# Patient Record
Sex: Female | Born: 1965 | Hispanic: No | Marital: Married | Smoking: Never smoker
Health system: Southern US, Community
[De-identification: ages and names within clinical notes are randomized; demographics above are authoritative.]

## PROBLEM LIST (undated history)

## (undated) DIAGNOSIS — D219 Benign neoplasm of connective and other soft tissue, unspecified: Secondary | ICD-10-CM

## (undated) DIAGNOSIS — R51 Headache: Secondary | ICD-10-CM

## (undated) DIAGNOSIS — K649 Unspecified hemorrhoids: Secondary | ICD-10-CM

## (undated) DIAGNOSIS — D696 Thrombocytopenia, unspecified: Secondary | ICD-10-CM

## (undated) DIAGNOSIS — D249 Benign neoplasm of unspecified breast: Secondary | ICD-10-CM

## (undated) DIAGNOSIS — N644 Mastodynia: Secondary | ICD-10-CM

## (undated) DIAGNOSIS — R519 Headache, unspecified: Secondary | ICD-10-CM

## (undated) DIAGNOSIS — N83209 Unspecified ovarian cyst, unspecified side: Secondary | ICD-10-CM

## (undated) DIAGNOSIS — D649 Anemia, unspecified: Secondary | ICD-10-CM

## (undated) HISTORY — DX: Benign neoplasm of unspecified breast: D24.9

## (undated) HISTORY — DX: Benign neoplasm of connective and other soft tissue, unspecified: D21.9

## (undated) HISTORY — DX: Headache: R51

## (undated) HISTORY — DX: Mastodynia: N64.4

## (undated) HISTORY — DX: Unspecified hemorrhoids: K64.9

## (undated) HISTORY — DX: Thrombocytopenia, unspecified: D69.6

## (undated) HISTORY — DX: Unspecified ovarian cyst, unspecified side: N83.209

## (undated) HISTORY — DX: Headache, unspecified: R51.9

---

## 1999-12-23 ENCOUNTER — Other Ambulatory Visit: Admission: RE | Admit: 1999-12-23 | Discharge: 1999-12-23 | Payer: Self-pay | Admitting: Obstetrics and Gynecology

## 2000-08-21 ENCOUNTER — Other Ambulatory Visit: Admission: RE | Admit: 2000-08-21 | Discharge: 2000-08-21 | Payer: Self-pay | Admitting: Gynecology

## 2001-01-15 ENCOUNTER — Other Ambulatory Visit: Admission: RE | Admit: 2001-01-15 | Discharge: 2001-01-15 | Payer: Self-pay | Admitting: Obstetrics and Gynecology

## 2001-03-08 ENCOUNTER — Encounter: Payer: Self-pay | Admitting: Obstetrics and Gynecology

## 2001-03-08 ENCOUNTER — Inpatient Hospital Stay (HOSPITAL_COMMUNITY): Admission: AD | Admit: 2001-03-08 | Discharge: 2001-03-08 | Payer: Self-pay | Admitting: Obstetrics & Gynecology

## 2001-07-16 ENCOUNTER — Inpatient Hospital Stay (HOSPITAL_COMMUNITY): Admission: AD | Admit: 2001-07-16 | Discharge: 2001-07-16 | Payer: Self-pay | Admitting: Obstetrics and Gynecology

## 2001-08-04 ENCOUNTER — Inpatient Hospital Stay (HOSPITAL_COMMUNITY): Admission: AD | Admit: 2001-08-04 | Discharge: 2001-08-07 | Payer: Self-pay | Admitting: Obstetrics and Gynecology

## 2001-08-13 ENCOUNTER — Encounter: Admission: RE | Admit: 2001-08-13 | Discharge: 2001-09-12 | Payer: Self-pay | Admitting: Obstetrics and Gynecology

## 2002-04-07 ENCOUNTER — Encounter: Admission: RE | Admit: 2002-04-07 | Discharge: 2002-04-07 | Payer: Self-pay | Admitting: Specialist

## 2002-04-07 ENCOUNTER — Encounter: Payer: Self-pay | Admitting: Specialist

## 2002-04-29 ENCOUNTER — Encounter: Admission: RE | Admit: 2002-04-29 | Discharge: 2002-04-29 | Payer: Self-pay | Admitting: Specialist

## 2002-04-29 ENCOUNTER — Encounter: Payer: Self-pay | Admitting: Specialist

## 2003-07-08 ENCOUNTER — Other Ambulatory Visit: Admission: RE | Admit: 2003-07-08 | Discharge: 2003-07-08 | Payer: Self-pay | Admitting: Obstetrics and Gynecology

## 2003-08-13 ENCOUNTER — Encounter: Payer: Self-pay | Admitting: Obstetrics and Gynecology

## 2003-08-13 ENCOUNTER — Ambulatory Visit (HOSPITAL_COMMUNITY): Admission: RE | Admit: 2003-08-13 | Discharge: 2003-08-13 | Payer: Self-pay | Admitting: Obstetrics and Gynecology

## 2003-09-02 ENCOUNTER — Ambulatory Visit (HOSPITAL_COMMUNITY): Admission: RE | Admit: 2003-09-02 | Discharge: 2003-09-02 | Payer: Self-pay | Admitting: Obstetrics and Gynecology

## 2003-09-02 ENCOUNTER — Encounter: Payer: Self-pay | Admitting: Obstetrics and Gynecology

## 2004-02-03 ENCOUNTER — Inpatient Hospital Stay (HOSPITAL_COMMUNITY): Admission: AD | Admit: 2004-02-03 | Discharge: 2004-02-05 | Payer: Self-pay | Admitting: Obstetrics and Gynecology

## 2004-02-06 ENCOUNTER — Encounter: Admission: RE | Admit: 2004-02-06 | Discharge: 2004-03-07 | Payer: Self-pay | Admitting: Obstetrics and Gynecology

## 2004-04-05 ENCOUNTER — Encounter: Admission: RE | Admit: 2004-04-05 | Discharge: 2004-05-05 | Payer: Self-pay | Admitting: Obstetrics and Gynecology

## 2004-05-05 ENCOUNTER — Inpatient Hospital Stay (HOSPITAL_COMMUNITY): Admission: EM | Admit: 2004-05-05 | Discharge: 2004-05-06 | Payer: Self-pay | Admitting: Emergency Medicine

## 2004-07-14 ENCOUNTER — Other Ambulatory Visit: Admission: RE | Admit: 2004-07-14 | Discharge: 2004-07-14 | Payer: Self-pay | Admitting: Obstetrics and Gynecology

## 2005-10-19 ENCOUNTER — Other Ambulatory Visit: Admission: RE | Admit: 2005-10-19 | Discharge: 2005-10-19 | Payer: Self-pay | Admitting: Obstetrics and Gynecology

## 2006-10-24 ENCOUNTER — Other Ambulatory Visit: Admission: RE | Admit: 2006-10-24 | Discharge: 2006-10-24 | Payer: Self-pay | Admitting: Obstetrics and Gynecology

## 2008-12-25 DIAGNOSIS — N644 Mastodynia: Secondary | ICD-10-CM

## 2008-12-25 DIAGNOSIS — D249 Benign neoplasm of unspecified breast: Secondary | ICD-10-CM

## 2008-12-25 HISTORY — DX: Mastodynia: N64.4

## 2008-12-25 HISTORY — DX: Benign neoplasm of unspecified breast: D24.9

## 2008-12-29 ENCOUNTER — Encounter: Admission: RE | Admit: 2008-12-29 | Discharge: 2008-12-29 | Payer: Self-pay | Admitting: Obstetrics and Gynecology

## 2009-07-02 ENCOUNTER — Encounter: Admission: RE | Admit: 2009-07-02 | Discharge: 2009-07-02 | Payer: Self-pay | Admitting: Obstetrics and Gynecology

## 2009-12-30 ENCOUNTER — Encounter: Admission: RE | Admit: 2009-12-30 | Discharge: 2009-12-30 | Payer: Self-pay | Admitting: Obstetrics and Gynecology

## 2010-06-21 ENCOUNTER — Encounter: Admission: RE | Admit: 2010-06-21 | Discharge: 2010-06-21 | Payer: Self-pay | Admitting: Obstetrics and Gynecology

## 2011-01-23 ENCOUNTER — Encounter
Admission: RE | Admit: 2011-01-23 | Discharge: 2011-01-23 | Payer: Self-pay | Source: Home / Self Care | Attending: Obstetrics and Gynecology | Admitting: Obstetrics and Gynecology
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
## 2011-05-12 NOTE — H&P (Signed)
[facility]  Patient:    Lynn Ochoa, Lynn Ochoa                          MRN: [id number] Adm. Date:  [id number] Disc. Date: [id number] Attending:  Shaune Spittle Dictator:   Mack Guise, C.N.M.                         History and Physical  HISTORY OF PRESENT ILLNESS:   [name] is a 45 year old, gravida 2, para 0-0-1-0 at 39-3/7 weeks, EDD August 09, 2001, who presents with spontaneous rupture of membranes for clear fluid at 4 oclock this a.m. She is not feeling any contractions. She reports positive fetal movement, no bleeding, and denies any headache, visual changes, or epigastric pain. Her pregnancy has been followed by the CNM service at [facility] and is remarkable for (1) long cycles, (2) equivocal rubella, (3) thrombocytopenia, (4) group B strep negative. Her pregnancy was initially evaluated at the office of [facility] on January 15, 2001 at approximately [redacted] weeks gestation. Her pregnancy has been complicated by thrombocytopenia which has remained fairly stable at approximately 119,000 platelets when checked on a weekly basis. She has been size equal to dates throughout and has remained normotensive with no proteinuria.  PRENATAL LABORATORY DATA:     On January 15, 2001 finds hemoglobin and hematocrit 13.2 and 40.9, platelets 123,000. Blood type and Rh A positive, antibody screen negative, VDRL nonreactive, rubella equivocal. Hepatitis B surface antigen negative. Pap smear within normal limits. AFP/free beta hCG declined. At 28 weeks one hour glucose challenge 135 and at 36 weeks culture of the vaginal tract is negative.  OBSTETRIC HISTORY:            In 2000 the patient had an induced AB and a a ______ [facility] for the products of conception and the present pregnancy.  PAST MEDICAL HISTORY:         Unremarkable.  FAMILY HISTORY:               Both parents with heart disease. Patients father with a history of chronic hypertension.  GENETIC HISTORY:               Patients brothers baby is deaf.  ALLERGIES:                    No known drug allergies.  SOCIAL HISTORY:               Patient denies the use of tobacco, alcohol, or illicit drugs. [name] is a 45 year old [place] female. Her husband, Lynn Ochoa, is involved and supportive. They do not follow a religious faith.  REVIEW OF SYSTEMS:            There are no signs or symptoms suggestive of focal or systemic disease and the patient is typical of one with a uterine pregnancy at term with premature rupture of membranes in early labor.  PHYSICAL EXAMINATION:  VITAL SIGNS:                  Stable, afebrile.  HEENT:                        Unremarkable.  HEART:                        Regular rate and rhythm.  LUNGS:  Clear.  ABDOMEN:                      Gravid in its contour. Uterine fundus is noted to extend 39 cm above the level of the pubis symphysis. Leopolds maneuvers find the infant to be in a longitudinal lie, cephalic presentation, and the estimated fetal weight is 7-1/2 pounds.  PELVIC:                       Patient is grossly ruptured with copious amounts of clear fluid. On digital exam she is 1 cm dilated, 70% effaced with a cephalic presenting part high, checked by bedside ultrasound and vertex is the presenting part.  ELECTRONIC FETAL MONITORING   Fetal heart rate is reassuring with no decelerations noted. Patient is contracting mildly every three to six minutes.  EXTREMITIES:                  No pathologic edema. DTRs are 1+ with no clonus.  ASSESSMENT:                   1. Intrauterine pregnancy at term.                               2. Premature rupture of membranes.                               3. Thrombocytopenia.  PLAN:                         1. Admit per Dr. Stefano Gaul.                               2. Routine CNM orders. DD:  08/04/01 TD:  08/04/01 Job: [id number]

## 2011-05-12 NOTE — Op Note (Signed)
   Lynn Ochoa, Lynn Ochoa                            ACCOUNT NO.:  [id number]   MEDICAL RECORD NO.:  [id number]                   PATIENT TYPE:  OUT   LOCATION:  ULT                                  [facility]:  [facility]   PHYSICIAN:  Crist Fat. Rivard, M.D.              DATE OF BIRTH:  [birth year]   DATE OF PROCEDURE:  08/13/2003  DATE OF DISCHARGE:                                 OPERATIVE REPORT   PREOPERATIVE DIAGNOSES:  1. Intrauterine pregnancy at 15 weeks' and three days.  2. Advanced maternal age.   POSTOPERATIVE DIAGNOSES:  1. Intrauterine pregnancy at 15 weeks' and three days.  2. Advanced maternal age.   ANESTHESIA:  Local.   PROCEDURE:  Genetic amniocentesis.   SURGEON:  Crist Fat. Rivard, M.D.   PROCEDURE:  After being informed of the planned procedure with possible  complications, including bleeding, infection, and miscarriage rate of 1 in  200, informed consent was obtained and patient was placed in the dorsal  decubitus position.  An ultrasound revealed a size-equal-dates single  intrauterine pregnancy, normal amniotic fluid, posterior placenta, fetal  heart rate of 162 beats per minute.  We proceeded with disinfection of the  abdomen, local anesthesia using lidocaine 1% 3 mL and using a 22-gauge  SonoGuide needle, we performed a single puncture amniocentesis removing 22  mL of clear fluid without complication.  Post amniocentesis fetal heart rate  was 153 beats per minute.  The patient was very well tolerated by the  patient who was discharged home on low physical activity for 24 hours.                                               Crist Fat Rivard, M.D.    SAR/MEDQ  D:  08/13/2003  T:  08/13/2003  Job:  [id number]

## 2012-03-04 ENCOUNTER — Other Ambulatory Visit: Payer: Self-pay | Admitting: Obstetrics and Gynecology

## 2012-03-04 DIAGNOSIS — Z1231 Encounter for screening mammogram for malignant neoplasm of breast: Secondary | ICD-10-CM

## 2012-03-08 ENCOUNTER — Ambulatory Visit
Admission: RE | Admit: 2012-03-08 | Discharge: 2012-03-08 | Disposition: A | Discharge status: Home / Self Care | Payer: PRIVATE HEALTH INSURANCE | Source: Ambulatory Visit | Attending: Obstetrics and Gynecology | Admitting: Obstetrics and Gynecology

## 2012-03-08 DIAGNOSIS — Z1231 Encounter for screening mammogram for malignant neoplasm of breast: Secondary | ICD-10-CM

## 2012-03-12 ENCOUNTER — Other Ambulatory Visit: Payer: Self-pay | Admitting: Obstetrics and Gynecology

## 2012-03-12 DIAGNOSIS — R928 Other abnormal and inconclusive findings on diagnostic imaging of breast: Secondary | ICD-10-CM

## 2012-03-19 ENCOUNTER — Ambulatory Visit
Admission: RE | Admit: 2012-03-19 | Discharge: 2012-03-19 | Disposition: A | Discharge status: Home / Self Care | Payer: PRIVATE HEALTH INSURANCE | Source: Ambulatory Visit | Attending: Obstetrics and Gynecology | Admitting: Obstetrics and Gynecology

## 2012-03-19 DIAGNOSIS — R928 Other abnormal and inconclusive findings on diagnostic imaging of breast: Secondary | ICD-10-CM

## 2012-11-06 ENCOUNTER — Ambulatory Visit: Payer: PRIVATE HEALTH INSURANCE | Admitting: Obstetrics and Gynecology

## 2012-11-20 ENCOUNTER — Encounter: Payer: Self-pay | Admitting: Obstetrics and Gynecology

## 2012-11-20 ENCOUNTER — Ambulatory Visit (INDEPENDENT_AMBULATORY_CARE_PROVIDER_SITE_OTHER): Payer: PRIVATE HEALTH INSURANCE | Admitting: Obstetrics and Gynecology

## 2012-11-20 VITALS — BP 100/60 | HR 90 | Ht 63.0 in | Wt 152.0 lb

## 2012-11-20 DIAGNOSIS — N83209 Unspecified ovarian cyst, unspecified side: Secondary | ICD-10-CM | POA: Insufficient documentation

## 2012-11-20 DIAGNOSIS — Z124 Encounter for screening for malignant neoplasm of cervix: Secondary | ICD-10-CM

## 2012-11-20 NOTE — Patient Instructions (Signed)
Ovarian Cyst The ovaries are small organs that are on each side of the uterus. The ovaries are the organs that produce the female hormones, estrogen and progesterone. An ovarian cyst is a sac filled with fluid that can vary in its size. It is normal for a small cyst to form in women who are in the childbearing age and who have menstrual periods. This type of cyst is called a follicle cyst that becomes an ovulation cyst (corpus luteum cyst) after it produces the women's egg. It later goes away on its own if the woman does not become pregnant. There are other kinds of ovarian cysts that may cause problems and may need to be treated. The most serious problem is a cyst with cancer. It should be noted that menopausal women who have an ovarian cyst are at a higher risk of it being a cancer cyst. They should be evaluated very quickly, thoroughly and followed closely. This is especially true in menopausal women because of the high rate of ovarian cancer in women in menopause. CAUSES AND TYPES OF OVARIAN CYSTS:  FUNCTIONAL CYST: The follicle/corpus luteum cyst is a functional cyst that occurs every month during ovulation with the menstrual cycle. They go away with the next menstrual cycle if the woman does not get pregnant. Usually, there are no symptoms with a functional cyst.  ENDOMETRIOMA CYST: This cyst develops from the lining of the uterus tissue. This cyst gets in or on the ovary. It grows every month from the bleeding during the menstrual period. It is also called a "chocolate cyst" because it becomes filled with blood that turns brown. This cyst can cause pain in the lower abdomen during intercourse and with your menstrual period.  CYSTADENOMA CYST: This cyst develops from the cells on the outside of the ovary. They usually are not cancerous. They can get very big and cause lower abdomen pain and pain with intercourse. This type of cyst can twist on itself, cut off its blood supply and cause severe pain. It  also can easily rupture and cause a lot of pain.  DERMOID CYST: This type of cyst is sometimes found in both ovaries. They are found to have different kinds of body tissue in the cyst. The tissue includes skin, teeth, hair, and/or cartilage. They usually do not have symptoms unless they get very big. Dermoid cysts are rarely cancerous.  POLYCYSTIC OVARY: This is a rare condition with hormone problems that produces many small cysts on both ovaries. The cysts are follicle-like cysts that never produce an egg and become a corpus luteum. It can cause an increase in body weight, infertility, acne, increase in body and facial hair and lack of menstrual periods or rare menstrual periods. Many women with this problem develop type 2 diabetes. The exact cause of this problem is unknown. A polycystic ovary is rarely cancerous.  THECA LUTEIN CYST: Occurs when too much hormone (human chorionic gonadotropin) is produced and over-stimulates the ovaries to produce an egg. They are frequently seen when doctors stimulate the ovaries for invitro-fertilization (test tube babies).  LUTEOMA CYST: This cyst is seen during pregnancy. Rarely it can cause an obstruction to the birth canal during labor and delivery. They usually go away after delivery. SYMPTOMS   Pelvic pain or pressure.  Pain during sexual intercourse.  Increasing girth (swelling) of the abdomen.  Abnormal menstrual periods.  Increasing pain with menstrual periods.  You stop having menstrual periods and you are not pregnant. DIAGNOSIS  The diagnosis can   be made during:  Routine or annual pelvic examination (common).  Ultrasound.  X-ray of the pelvis.  CT Scan.  MRI.  Blood tests. TREATMENT   Treatment may only be to follow the cyst monthly for 2 to 3 months with your caregiver. Many go away on their own, especially functional cysts.  May be aspirated (drained) with a long needle with ultrasound, or by laparoscopy (inserting a tube into  the pelvis through a small incision).  The whole cyst can be removed by laparoscopy.  Sometimes the cyst may need to be removed through an incision in the lower abdomen.  Hormone treatment is sometimes used to help dissolve certain cysts.  Birth control pills are sometimes used to help dissolve certain cysts. HOME CARE INSTRUCTIONS  Follow your caregiver's advice regarding:  Medicine.  Follow up visits to evaluate and treat the cyst.  You may need to come back or make an appointment with another caregiver, to find the exact cause of your cyst, if your caregiver is not a gynecologist.  Get your yearly and recommended pelvic examinations and Pap tests.  Let your caregiver know if you have had an ovarian cyst in the past. SEEK MEDICAL CARE IF:   Your periods are late, irregular, they stop, or are painful.  Your stomach (abdomen) or pelvic pain does not go away.  Your stomach becomes larger or swollen.  You have pressure on your bladder or trouble emptying your bladder completely.  You have painful sexual intercourse.  You have feelings of fullness, pressure, or discomfort in your stomach.  You lose weight for no apparent reason.  You feel generally ill.  You become constipated.  You lose your appetite.  You develop acne.  You have an increase in body and facial hair.  You are gaining weight, without changing your exercise and eating habits.  You think you are pregnant. SEEK IMMEDIATE MEDICAL CARE IF:   You have increasing abdominal pain.  You feel sick to your stomach (nausea) and/or vomit.  You develop a fever that comes on suddenly.  You develop abdominal pain during a bowel movement.  Your menstrual periods become heavier than usual. Document Released: 12/11/2005 Document Revised: 03/04/2012 Document Reviewed: 10/14/2009 Exit[employer] Patient Information 2013 [employer].  

## 2012-11-20 NOTE — Progress Notes (Signed)
Subjective:  Last Pap: 11/06/11 WNL: Yes Regular Periods:yes Contraception: condoms  Monthly Breast exam:no Tetanus<57yrs:yes Nl.Bladder Function:yes Daily BMs:no Healthy Diet:no Calcium:no Mammogram:yes Date of Mammogram: 03/19/12 ltd diagnostic on left breast for possible mass  Exercise:no Have often Exercise: n/a Seatbelt: yes Abuse at home: no Stressful work:no Sigmoid-colonoscopy: n/a Bone Density: No PCP: none Change in PMH: none  Change in [facility]: none  Lynn Ochoa is a 46 y.o. female, G3P2, who presents for an annual exam. Has some LLQ pain.  Also has intermittent right lateral breast pain.  Drinks several caffieneated beverages daily    History   Social History  . Marital Status: Married    Spouse Name: N/A    Number of Children: N/A  . Years of Education: N/A   Social History Main Topics  . Smoking status: Never Smoker   . Smokeless tobacco: None  . Alcohol Use: No  . Drug Use: No  . Sexually Active: None   Other Topics Concern  . None   Social History Narrative  . None    Menstrual cycle:   LMP: Patient's last menstrual period was 11/13/2012.           Cycle: monthly for 4-5 days, light.  Some LLQ pain with menses, but takes no meds.  No dyspareunia  The following portions of the patient's history were reviewed and updated as appropriate: allergies, current medications, past family history, past medical history, past social history, past surgical history and problem list.  Review of Systems Pertinent items are noted in HPI. Breast:Negative for breast lump,nipple discharge or nipple retraction Gastrointestinal: Negative for abdominal pain, change in bowel habits or rectal bleeding Urinary:negative   Objective:    BP 100/60  Pulse 90  Ht 5\' 3"  (1.6 m)  Wt 152 lb (68.947 kg)  BMI 26.93 kg/m2  LMP 11/13/2012    Weight:  Wt Readings from Last 1 Encounters:  11/20/12 152 lb (68.947 kg)          BMI: Body mass index is 26.93 kg/(m^2).  General  Appearance: Alert, appropriate appearance for age. No acute distress HEENT: Grossly normal Neck / Thyroid: Supple, no masses, nodes or enlargement Lungs: clear to auscultation bilaterally Back: No CVA tenderness Breast Exam: No masses or nodes.No dimpling, nipple retraction or discharge. Cardiovascular: Regular rate and rhythm. S1, S2, no murmur Gastrointestinal: Soft, non-tender, no masses or organomegaly Pelvic Exam: External genitalia: normal general appearance Vaginal: normal mucosa without prolapse or lesions Cervix: normal appearance Adnexa: LLQ tenderness and fullness Uterus: normal single, nontender Rectovaginal: normal rectal, no masses Lymphatic Exam: Non-palpable nodes in neck, clavicular, axillary, or inguinal regions Skin: no rash or abnormalities Neurologic: Normal gait and speech, no tremor  Psychiatric: Alert and oriented, appropriate affect.   Wet Prep:not applicable Urinalysis:not applicable UPT: Not done   Assessment:    Hx left ovarian cyst for 11 years stable in size with pt declining surgical intervention  Intermittent pelvic pain Mastodynia with negative mammogram workup   Plan:    mammogram pap smear return annually or prn RTO for followup U/S STD screening: declined Contraception:condoms   Dierdre Forth MD

## 2012-11-25 LAB — PAP IG AND HPV HIGH-RISK

## 2013-01-16 ENCOUNTER — Ambulatory Visit: Payer: PRIVATE HEALTH INSURANCE

## 2013-01-16 ENCOUNTER — Other Ambulatory Visit: Payer: Self-pay | Admitting: Obstetrics and Gynecology

## 2013-01-16 ENCOUNTER — Encounter: Payer: Self-pay | Admitting: Obstetrics and Gynecology

## 2013-01-16 ENCOUNTER — Ambulatory Visit: Payer: PRIVATE HEALTH INSURANCE | Admitting: Obstetrics and Gynecology

## 2013-01-16 VITALS — BP 110/64 | Ht 62.0 in | Wt 151.0 lb

## 2013-01-16 DIAGNOSIS — N83209 Unspecified ovarian cyst, unspecified side: Secondary | ICD-10-CM

## 2013-01-16 NOTE — Progress Notes (Signed)
FOLLOW UP   ULTRASOUND: Uterus: Length: 7.36 cm   Width:  4.87 cm   Height:  4.41 cm ROV:  Length: 2.51 cm   Width: 1.87 cm   Height: 1.60 cm LOV:   Length: 6.08 cm   Width: 4.87 cm   Height: 3.93 cm  Endo thickness:  0.496 cm   Left ovary: Separated Ovarian Cyst is visualized Right ovary:Normal Fibroids:yes  Number:  1n/a  Size(s):1.1 x 1 x 1.1 cm  CDS fluid:no  Comment: One intramural fibroid is seen. Right Posterior uterus. Endometrium tri layered - WNLs Low level internal echoes are seen. But predominantly simple in appearance.  Raises question of an endometrioma. Chart history reports that this cyst lesion has been visualized over an 11 year period. Measures: 5.6 x 4.1 x 4.0 cm  ASSESSMENT: Stable asymptomatic ovarian cyst for 11 years  PLAN:  Pt declines removal. Sx of torsion and/or rupture reviewed. Will image again with change in sx or exam

## 2013-04-25 ENCOUNTER — Other Ambulatory Visit: Payer: Self-pay | Admitting: Family Medicine

## 2013-04-25 DIAGNOSIS — R1032 Left lower quadrant pain: Secondary | ICD-10-CM

## 2013-04-25 DIAGNOSIS — K5900 Constipation, unspecified: Secondary | ICD-10-CM

## 2013-05-05 ENCOUNTER — Ambulatory Visit
Admission: RE | Admit: 2013-05-05 | Discharge: 2013-05-05 | Disposition: A | Discharge status: Home / Self Care | Payer: PRIVATE HEALTH INSURANCE | Source: Ambulatory Visit | Attending: Family Medicine | Admitting: Family Medicine

## 2013-05-05 DIAGNOSIS — R1032 Left lower quadrant pain: Secondary | ICD-10-CM

## 2013-05-05 DIAGNOSIS — K5900 Constipation, unspecified: Secondary | ICD-10-CM

## 2013-05-05 MED ORDER — IOHEXOL 300 MG/ML  SOLN
100.0000 mL | Freq: Once | INTRAMUSCULAR | Status: AC | PRN
  Administered 2013-05-05: 100 mL via INTRAVENOUS

## 2014-05-11 ENCOUNTER — Other Ambulatory Visit: Payer: Self-pay | Admitting: Family Medicine

## 2014-05-11 DIAGNOSIS — R911 Solitary pulmonary nodule: Secondary | ICD-10-CM

## 2014-10-26 ENCOUNTER — Encounter: Payer: Self-pay | Admitting: Obstetrics and Gynecology

## 2015-04-15 ENCOUNTER — Other Ambulatory Visit: Payer: Self-pay | Admitting: Family Medicine

## 2015-04-15 DIAGNOSIS — R911 Solitary pulmonary nodule: Secondary | ICD-10-CM

## 2015-04-30 ENCOUNTER — Other Ambulatory Visit: Payer: Self-pay | Admitting: Family Medicine

## 2015-04-30 DIAGNOSIS — R911 Solitary pulmonary nodule: Secondary | ICD-10-CM

## 2015-05-05 ENCOUNTER — Ambulatory Visit
Admission: RE | Admit: 2015-05-05 | Discharge: 2015-05-05 | Disposition: A | Discharge status: Home / Self Care | Payer: Commercial Managed Care - PPO | Source: Ambulatory Visit | Attending: Family Medicine | Admitting: Family Medicine

## 2015-05-05 DIAGNOSIS — R911 Solitary pulmonary nodule: Secondary | ICD-10-CM

## 2015-10-25 ENCOUNTER — Other Ambulatory Visit: Payer: Self-pay

## 2015-10-25 ENCOUNTER — Other Ambulatory Visit: Payer: Self-pay | Admitting: Family Medicine

## 2015-10-25 DIAGNOSIS — E0789 Other specified disorders of thyroid: Secondary | ICD-10-CM

## 2015-10-26 ENCOUNTER — Other Ambulatory Visit: Payer: Self-pay

## 2015-10-26 ENCOUNTER — Other Ambulatory Visit: Payer: Self-pay | Admitting: Family Medicine

## 2015-10-26 ENCOUNTER — Ambulatory Visit
Admission: RE | Admit: 2015-10-26 | Discharge: 2015-10-26 | Disposition: A | Discharge status: Home / Self Care | Payer: Commercial Managed Care - PPO | Source: Ambulatory Visit | Attending: Family Medicine | Admitting: Family Medicine

## 2015-10-26 ENCOUNTER — Inpatient Hospital Stay
Admission: RE | Admit: 2015-10-26 | Discharge: 2015-10-26 | Disposition: A | Discharge status: Home / Self Care | Payer: Self-pay | Source: Ambulatory Visit | Attending: Family Medicine | Admitting: Family Medicine

## 2015-10-26 DIAGNOSIS — E0789 Other specified disorders of thyroid: Secondary | ICD-10-CM

## 2017-03-21 DIAGNOSIS — G4452 New daily persistent headache (NDPH): Secondary | ICD-10-CM | POA: Diagnosis not present

## 2017-03-29 ENCOUNTER — Other Ambulatory Visit: Payer: Self-pay | Admitting: Family Medicine

## 2017-03-29 DIAGNOSIS — G4452 New daily persistent headache (NDPH): Secondary | ICD-10-CM

## 2017-04-08 ENCOUNTER — Ambulatory Visit
Admission: RE | Admit: 2017-04-08 | Discharge: 2017-04-08 | Disposition: A | Discharge status: Home / Self Care | Payer: Commercial Managed Care - PPO | Source: Ambulatory Visit | Attending: Family Medicine | Admitting: Family Medicine

## 2017-04-08 DIAGNOSIS — R51 Headache: Secondary | ICD-10-CM | POA: Diagnosis not present

## 2017-04-08 DIAGNOSIS — G4452 New daily persistent headache (NDPH): Secondary | ICD-10-CM

## 2017-04-08 MED ORDER — GADOBENATE DIMEGLUMINE 529 MG/ML IV SOLN
15.0000 mL | Freq: Once | INTRAVENOUS | Status: AC | PRN
  Administered 2017-04-08: 15 mL via INTRAVENOUS

## 2017-05-31 DIAGNOSIS — N920 Excessive and frequent menstruation with regular cycle: Secondary | ICD-10-CM | POA: Diagnosis not present

## 2017-06-12 DIAGNOSIS — R339 Retention of urine, unspecified: Secondary | ICD-10-CM | POA: Diagnosis not present

## 2017-06-12 DIAGNOSIS — R7303 Prediabetes: Secondary | ICD-10-CM | POA: Diagnosis not present

## 2017-06-12 DIAGNOSIS — R102 Pelvic and perineal pain: Secondary | ICD-10-CM | POA: Diagnosis not present

## 2017-06-12 DIAGNOSIS — R3121 Asymptomatic microscopic hematuria: Secondary | ICD-10-CM | POA: Diagnosis not present

## 2017-06-12 DIAGNOSIS — R3914 Feeling of incomplete bladder emptying: Secondary | ICD-10-CM | POA: Diagnosis not present

## 2017-06-25 ENCOUNTER — Inpatient Hospital Stay (HOSPITAL_COMMUNITY): Payer: Commercial Managed Care - PPO | Admitting: Anesthesiology

## 2017-06-25 ENCOUNTER — Other Ambulatory Visit: Payer: Self-pay | Admitting: Obstetrics and Gynecology

## 2017-06-25 ENCOUNTER — Encounter (HOSPITAL_COMMUNITY): Payer: Self-pay | Admitting: *Deleted

## 2017-06-25 ENCOUNTER — Observation Stay (HOSPITAL_COMMUNITY)
Admission: AD | Admit: 2017-06-25 | Discharge: 2017-06-26 | Disposition: A | Discharge status: Home / Self Care | Payer: Commercial Managed Care - PPO | Source: Ambulatory Visit | Attending: Obstetrics and Gynecology | Admitting: Obstetrics and Gynecology

## 2017-06-25 ENCOUNTER — Encounter (HOSPITAL_COMMUNITY)
Admission: AD | Disposition: A | Discharge status: Home / Self Care | Payer: Self-pay | Source: Ambulatory Visit | Attending: Obstetrics and Gynecology

## 2017-06-25 ENCOUNTER — Ambulatory Visit: Admit: 2017-06-25 | Payer: Commercial Managed Care - PPO | Admitting: Obstetrics and Gynecology

## 2017-06-25 ENCOUNTER — Encounter (HOSPITAL_COMMUNITY): Payer: Self-pay

## 2017-06-25 DIAGNOSIS — D259 Leiomyoma of uterus, unspecified: Principal | ICD-10-CM | POA: Insufficient documentation

## 2017-06-25 DIAGNOSIS — Z9114 Patient's other noncompliance with medication regimen: Secondary | ICD-10-CM | POA: Diagnosis not present

## 2017-06-25 DIAGNOSIS — R1909 Other intra-abdominal and pelvic swelling, mass and lump: Secondary | ICD-10-CM | POA: Diagnosis not present

## 2017-06-25 DIAGNOSIS — D649 Anemia, unspecified: Secondary | ICD-10-CM | POA: Diagnosis present

## 2017-06-25 DIAGNOSIS — Z8249 Family history of ischemic heart disease and other diseases of the circulatory system: Secondary | ICD-10-CM | POA: Insufficient documentation

## 2017-06-25 DIAGNOSIS — D250 Submucous leiomyoma of uterus: Secondary | ICD-10-CM | POA: Diagnosis present

## 2017-06-25 DIAGNOSIS — N920 Excessive and frequent menstruation with regular cycle: Secondary | ICD-10-CM | POA: Diagnosis present

## 2017-06-25 HISTORY — DX: Anemia, unspecified: D64.9

## 2017-06-25 HISTORY — PX: HYSTEROSCOPY WITH RESECTOSCOPE: SHX5395

## 2017-06-25 LAB — CBC
HEMATOCRIT: 17.7 % — AB (ref 36.0–46.0)
HEMOGLOBIN: 5.4 g/dL — AB (ref 12.0–15.0)
MCH: 26.1 pg (ref 26.0–34.0)
MCHC: 30.5 g/dL (ref 30.0–36.0)
MCV: 85.5 fL (ref 78.0–100.0)
Platelets: 267 10*3/uL (ref 150–400)
RBC: 2.07 MIL/uL — AB (ref 3.87–5.11)
RDW: 17.1 % — ABNORMAL HIGH (ref 11.5–15.5)
WBC: 9 10*3/uL (ref 4.0–10.5)

## 2017-06-25 LAB — URINALYSIS, ROUTINE W REFLEX MICROSCOPIC
Bacteria, UA: NONE SEEN
Bilirubin Urine: NEGATIVE
Glucose, UA: NEGATIVE mg/dL
KETONES UR: NEGATIVE mg/dL
Leukocytes, UA: NEGATIVE
Nitrite: NEGATIVE
PH: 5 (ref 5.0–8.0)
PROTEIN: NEGATIVE mg/dL
SQUAMOUS EPITHELIAL / LPF: NONE SEEN
Specific Gravity, Urine: 1.006 (ref 1.005–1.030)

## 2017-06-25 LAB — ABO/RH: ABO/RH(D): A POS

## 2017-06-25 LAB — POCT PREGNANCY, URINE: Preg Test, Ur: NEGATIVE

## 2017-06-25 LAB — PREPARE RBC (CROSSMATCH)

## 2017-06-25 SURGERY — HYSTEROSCOPY, USING RESECTOSCOPE
Anesthesia: General | Site: Vagina

## 2017-06-25 MED ORDER — FENTANYL CITRATE (PF) 100 MCG/2ML IJ SOLN
INTRAMUSCULAR | Status: DC | PRN
  Administered 2017-06-25 (×2): 25 ug via INTRAVENOUS
  Administered 2017-06-25: 50 ug via INTRAVENOUS

## 2017-06-25 MED ORDER — LIDOCAINE HCL (CARDIAC) 20 MG/ML IV SOLN
INTRAVENOUS | Status: DC | PRN
  Administered 2017-06-25: 50 mg via INTRAVENOUS

## 2017-06-25 MED ORDER — PROMETHAZINE HCL 25 MG/ML IJ SOLN
6.2500 mg | INTRAMUSCULAR | Status: DC | PRN
  Administered 2017-06-25: 6.25 mg via INTRAVENOUS

## 2017-06-25 MED ORDER — PROPOFOL 10 MG/ML IV BOLUS
INTRAVENOUS | Status: AC
  Filled 2017-06-25: qty 20

## 2017-06-25 MED ORDER — SODIUM CHLORIDE 0.9 % IR SOLN
Status: DC | PRN
  Administered 2017-06-25: 3000 mL

## 2017-06-25 MED ORDER — FAMOTIDINE IN NACL 20-0.9 MG/50ML-% IV SOLN
INTRAVENOUS | Status: AC
  Administered 2017-06-25: 20 mg via INTRAVENOUS
  Filled 2017-06-25: qty 50

## 2017-06-25 MED ORDER — SODIUM CHLORIDE 0.9 % IV SOLN: Freq: Once | INTRAVENOUS | Status: DC

## 2017-06-25 MED ORDER — PROPOFOL 10 MG/ML IV BOLUS
INTRAVENOUS | Status: DC | PRN
  Administered 2017-06-25: 100 mg via INTRAVENOUS

## 2017-06-25 MED ORDER — FENTANYL CITRATE (PF) 100 MCG/2ML IJ SOLN
INTRAMUSCULAR | Status: AC
  Filled 2017-06-25: qty 2

## 2017-06-25 MED ORDER — HYDROMORPHONE HCL 1 MG/ML IJ SOLN: 0.2500 mg | INTRAMUSCULAR | Status: DC | PRN

## 2017-06-25 MED ORDER — PHENYLEPHRINE 40 MCG/ML (10ML) SYRINGE FOR IV PUSH (FOR BLOOD PRESSURE SUPPORT)
PREFILLED_SYRINGE | INTRAVENOUS | Status: AC
  Filled 2017-06-25: qty 10

## 2017-06-25 MED ORDER — PROMETHAZINE HCL 25 MG/ML IJ SOLN
INTRAMUSCULAR | Status: AC
  Filled 2017-06-25: qty 1

## 2017-06-25 MED ORDER — MIDAZOLAM HCL 2 MG/2ML IJ SOLN
INTRAMUSCULAR | Status: DC | PRN
  Administered 2017-06-25 (×2): 1 mg via INTRAVENOUS

## 2017-06-25 MED ORDER — DEXTROSE 5 % IV SOLN
2.0000 g | INTRAVENOUS | Status: AC
  Administered 2017-06-25: 2 g via INTRAVENOUS
  Filled 2017-06-25: qty 2

## 2017-06-25 MED ORDER — OXYCODONE HCL 5 MG/5ML PO SOLN: 5.0000 mg | Freq: Once | ORAL | Status: DC | PRN

## 2017-06-25 MED ORDER — SUCCINYLCHOLINE CHLORIDE 20 MG/ML IJ SOLN
INTRAMUSCULAR | Status: DC | PRN
  Administered 2017-06-25: 100 mg via INTRAVENOUS

## 2017-06-25 MED ORDER — OXYCODONE HCL 5 MG PO TABS: 5.0000 mg | ORAL_TABLET | Freq: Once | ORAL | Status: DC | PRN

## 2017-06-25 MED ORDER — PHENYLEPHRINE HCL 10 MG/ML IJ SOLN
INTRAMUSCULAR | Status: DC | PRN
  Administered 2017-06-25 (×8): 80 ug via INTRAVENOUS

## 2017-06-25 MED ORDER — MEPERIDINE HCL 25 MG/ML IJ SOLN: 6.2500 mg | INTRAMUSCULAR | Status: DC | PRN

## 2017-06-25 MED ORDER — KETOROLAC TROMETHAMINE 30 MG/ML IJ SOLN: 30.0000 mg | Freq: Four times a day (QID) | INTRAMUSCULAR | Status: DC

## 2017-06-25 MED ORDER — LACTATED RINGERS IV SOLN
INTRAVENOUS | Status: DC
  Administered 2017-06-25: 125 mL/h via INTRAVENOUS

## 2017-06-25 MED ORDER — MIDAZOLAM HCL 2 MG/2ML IJ SOLN
INTRAMUSCULAR | Status: AC
  Filled 2017-06-25: qty 2

## 2017-06-25 MED ORDER — ONDANSETRON HCL 4 MG/2ML IJ SOLN
INTRAMUSCULAR | Status: DC | PRN
  Administered 2017-06-25: 4 mg via INTRAVENOUS

## 2017-06-25 MED ORDER — LACTATED RINGERS IV SOLN
INTRAVENOUS | Status: DC | PRN
  Administered 2017-06-25 (×2): via INTRAVENOUS

## 2017-06-25 MED ORDER — FAMOTIDINE IN NACL 20-0.9 MG/50ML-% IV SOLN
20.0000 mg | Freq: Once | INTRAVENOUS | Status: AC
  Administered 2017-06-25: 20 mg via INTRAVENOUS

## 2017-06-25 MED ORDER — KETOROLAC TROMETHAMINE 30 MG/ML IJ SOLN
INTRAMUSCULAR | Status: DC | PRN
  Administered 2017-06-25: 30 mg via INTRAVENOUS

## 2017-06-25 MED ORDER — DEXAMETHASONE SODIUM PHOSPHATE 10 MG/ML IJ SOLN
INTRAMUSCULAR | Status: DC | PRN
  Administered 2017-06-25: 4 mg via INTRAVENOUS

## 2017-06-25 MED ORDER — ONDANSETRON HCL 4 MG/2ML IJ SOLN
INTRAMUSCULAR | Status: AC
  Filled 2017-06-25: qty 2

## 2017-06-25 MED ORDER — LIDOCAINE HCL (CARDIAC) 20 MG/ML IV SOLN
INTRAVENOUS | Status: AC
  Filled 2017-06-25: qty 5

## 2017-06-25 MED ORDER — DEXAMETHASONE SODIUM PHOSPHATE 4 MG/ML IJ SOLN
INTRAMUSCULAR | Status: AC
  Filled 2017-06-25: qty 1

## 2017-06-25 MED ORDER — SUCCINYLCHOLINE CHLORIDE 200 MG/10ML IV SOSY
PREFILLED_SYRINGE | INTRAVENOUS | Status: AC
  Filled 2017-06-25: qty 10

## 2017-06-25 SURGICAL SUPPLY — 30 items
BIPOLAR CUTTING LOOP 21FR (ELECTRODE) ×1
BOOTIES KNEE HIGH SLOAN (MISCELLANEOUS) ×2 IMPLANT
CANISTER SUCT 3000ML PPV (MISCELLANEOUS) ×2 IMPLANT
CATH ROBINSON RED A/P 16FR (CATHETERS) ×2 IMPLANT
CLOTH BEACON ORANGE TIMEOUT ST (SAFETY) ×2 IMPLANT
CONTAINER PREFILL 10% NBF 60ML (FORM) ×2 IMPLANT
COUNTER NEEDLE 1200 MAGNETIC (NEEDLE) ×2 IMPLANT
DILATOR CANAL MILEX (MISCELLANEOUS) IMPLANT
ELECT COAG BIPOL BALL 21FR (ELECTRODE) IMPLANT
ELECT REM PT RETURN 9FT ADLT (ELECTROSURGICAL) ×4
ELECTRODE REM PT RTRN 9FT ADLT (ELECTROSURGICAL) ×2 IMPLANT
GLOVE BIOGEL PI IND STRL 7.0 (GLOVE) ×1 IMPLANT
GLOVE BIOGEL PI INDICATOR 7.0 (GLOVE) ×1
GLOVE SURG SS PI 6.5 STRL IVOR (GLOVE) ×4 IMPLANT
GOWN STRL REUS W/TWL LRG LVL3 (GOWN DISPOSABLE) ×6 IMPLANT
LOOP CUTTING BIPOLAR 21FR (ELECTRODE) ×1 IMPLANT
PACK VAGINAL MINOR WOMEN LF (CUSTOM PROCEDURE TRAY) ×2 IMPLANT
PAD OB MATERNITY 4.3X12.25 (PERSONAL CARE ITEMS) ×2 IMPLANT
PENCIL BUTTON HOLSTER BLD 10FT (ELECTRODE) ×2 IMPLANT
SUT VIC AB 0 CT1 27 (SUTURE) ×2
SUT VIC AB 0 CT1 27XBRD ANBCTR (SUTURE) ×2 IMPLANT
SUT VIC AB 1 CT1 36 (SUTURE) ×2 IMPLANT
SUT VICRYL 0 27 CT2 27 ABS (SUTURE) ×2 IMPLANT
SUT VICRYL 0 ENDOLOOP (SUTURE) ×4 IMPLANT
SUT VICRYL 0 UR6 27IN ABS (SUTURE) ×8 IMPLANT
TOWEL OR 17X24 6PK STRL BLUE (TOWEL DISPOSABLE) ×4 IMPLANT
TUBING AQUILEX INFLOW (TUBING) ×2 IMPLANT
TUBING AQUILEX OUTFLOW (TUBING) ×2 IMPLANT
TUBING NON-CON 1/4 X 20 CONN (TUBING) ×2 IMPLANT
YANKAUER SUCT BULB TIP NO VENT (SUCTIONS) ×2 IMPLANT

## 2017-06-25 NOTE — Transfer of Care (Signed)
Immediate Anesthesia Transfer of Care Note  Patient: Lynn Ochoa  Procedure(s) Performed: Procedure(s): HYSTEROSCOPY WITH REMOVAL CERVICAL FIBROID, DILATATION AND CURRATAGE (N/A)  Patient Location: PACU  Anesthesia Type:General  Level of Consciousness: awake, alert  and oriented  Airway & Oxygen Therapy: Patient Spontanous Breathing and Patient connected to nasal cannula oxygen  Post-op Assessment: Report given to RN and Post -op Vital signs reviewed and stable  Post vital signs: Reviewed and stable  Last Vitals:  Vitals:   06/25/17 1859  BP: (!) 125/55  Pulse: (!) 105  Resp: 17  Temp: 36.4 C    Last Pain:  Vitals:   06/25/17 1859  TempSrc: Oral         Complications: No apparent anesthesia complications

## 2017-06-25 NOTE — Progress Notes (Signed)
CRITICAL VALUE ALERT  Critical Value:  5.4  Date & Time Notied:  1939  Provider Notified: Dr Leo Grosser at bedside. Notified Danielle RN  Orders Received/Actions taken: None at this time.

## 2017-06-25 NOTE — H&P (Signed)
Lynn Ochoa is an 51 y.o. female. Who presents for removal of mass that is causing cervical dilation, probable fibroid  Pertinent Gynecological History: Menses: irregular occurring approximately every 26 days with spotting approximately 7 days per month and last cycle lasting since 05/25/17 Bleeding: dysfunctional uterine bleeding Contraception: condoms DES exposure: unknown Blood transfusions: none Sexually transmitted diseases: no past history Previous GYN Procedures: none  Last mammogram: normal Date: 2017 Last pap: normal Date:  OB History: G2, P2   Menstrual History: Menarche age: 81 No LMP recorded. Patient is not currently having periods (Reason: Perimenopausal).    Past Medical History:  Diagnosis Date  . Anemia   . Breast adenoma 2010  . Hemorrhoid   . Mastodynia 2010  . Simple ovarian cyst    left   . SVD (spontaneous vaginal delivery)    x 2  . Thrombocytopenia ([facility])    hx of    History reviewed. No pertinent surgical history.  Family History  Problem Relation Age of Onset  . Heart disease Mother   . Hypotension Mother   . Hypertension Father   . Heart disease Father     Social History:  reports that she has never smoked. She has never used smokeless tobacco. She reports that she does not drink alcohol or use drugs.  Allergies: No Known Allergies  No prescriptions prior to admission.    Review of Systems  Constitutional: Positive for malaise/fatigue.  HENT: Negative for tinnitus.        Can hear heart beat in her ear for the last 4 days  Respiratory: Positive for shortness of breath (with activity).   Cardiovascular: Positive for palpitations.  Gastrointestinal: Positive for abdominal pain (for 2 days) and constipation (with iron).  Genitourinary: Positive for dysuria.       Episode of urinary retention 1 week ago.  Musculoskeletal: Positive for back pain.  Skin:       PALE  Neurological: Positive for dizziness and headaches (posterior).   Finger numbness on awakening  Psychiatric/Behavioral: Negative.     Blood pressure (!) 125/55, pulse (!) 105, temperature 97.5 F (36.4 C), temperature source Oral, resp. rate 17, SpO2 100 %. Physical Exam  Constitutional: She is oriented to person, place, and time. She appears well-developed and well-nourished.  HENT:  Head: Normocephalic and atraumatic.  Eyes: EOM are normal.  Neck: Normal range of motion. Neck supple. No thyromegaly present.  Cardiovascular: Regular rhythm and normal heart sounds.   Respiratory: Effort normal and breath sounds normal.  GI: Soft. Bowel sounds are normal. She exhibits no mass. There is no tenderness.  Genitourinary:  Genitourinary Comments: EGBUS:  Blood covered VAGINA:  Blood in vault CERVIX:  Masses dilating the cervix to approximately 5 cm and blood surrounding UTERUS:  10 wks size nontender ADNEXAE:  No seperable masses  Musculoskeletal: Normal range of motion.  Neurological: She is alert and oriented to person, place, and time.  Skin: There is pallor.  Psychiatric: She has a normal mood and affect.    Results for orders placed or performed during the hospital encounter of 06/25/17 (from the past 24 hour(s))  CBC     Status: Abnormal   Collection Time: 06/25/17  7:14 PM  Result Value Ref Range   WBC 9.0 4.0 - 10.5 K/uL   RBC 2.07 (L) 3.87 - 5.11 MIL/uL   Hemoglobin 5.4 (LL) 12.0 - 15.0 g/dL   HCT 17.7 (L) 36.0 - 46.0 %   MCV 85.5 78.0 - 100.0 fL  MCH 26.1 26.0 - 34.0 pg   MCHC 30.5 30.0 - 36.0 g/dL   RDW 17.1 (H) 11.5 - 15.5 %   Platelets 267 150 - 400 K/uL  Type and screen     Status: None (Preliminary result)   Collection Time: 06/25/17  7:14 PM  Result Value Ref Range   ABO/RH(D) A POS    Antibody Screen PENDING    Sample Expiration 06/28/2017   Prepare RBC     Status: None   Collection Time: 06/25/17  7:16 PM  Result Value Ref Range   Order Confirmation ORDER PROCESSED BY BLOOD BANK     Assessment/Plan: Menorrhagia  probably from fibroids, now prolapsing through cervix Profound anemia with pt noncompliant with iron and refusing transfusion now except in absolute medical necessity Resection of prolapsing fibroid recommended and accepted.  Risks of anesthesia, bleeding, infectioon, and damage to adjacent organs reviewed.  I have also reviewed the possible need for transfusion as a result of blood loss during surgery along with the risks of transfusion.  .I also reviewed the possibility that hysterectomy would be required for control of the bleeding.  Lynn Ochoa P 06/25/2017, 7:57 PM

## 2017-06-25 NOTE — Anesthesia Preprocedure Evaluation (Signed)
Anesthesia Evaluation  Patient identified by MRN, date of birth, ID band Patient awake    Reviewed: Allergy & Precautions, NPO status , Patient's Chart, lab work & pertinent test results  Airway Mallampati: II  TM Distance: >3 FB Neck ROM: Full    Dental no notable dental hx.    Pulmonary neg pulmonary ROS,    Pulmonary exam normal breath sounds clear to auscultation       Cardiovascular negative cardio ROS Normal cardiovascular exam Rhythm:Regular Rate:Normal     Neuro/Psych negative neurological ROS  negative psych ROS   GI/Hepatic negative GI ROS, Neg liver ROS,   Endo/Other  negative endocrine ROS  Renal/GU negative Renal ROS     Musculoskeletal negative musculoskeletal ROS (+)   Abdominal   Peds  Hematology negative hematology ROS (+) anemia ,   Anesthesia Other Findings   Reproductive/Obstetrics negative OB ROS                             Anesthesia Physical Anesthesia Plan  ASA: II  Anesthesia Plan: General   Post-op Pain Management:    Induction: Intravenous  PONV Risk Score and Plan: 3 and Ondansetron, Dexamethasone, Propofol and Midazolam  Airway Management Planned: LMA  Additional Equipment:   Intra-op Plan:   Post-operative Plan: Extubation in OR  Informed Consent: I have reviewed the patients History and Physical, chart, labs and discussed the procedure including the risks, benefits and alternatives for the proposed anesthesia with the patient or authorized representative who has indicated his/her understanding and acceptance.   Dental advisory given  Plan Discussed with: CRNA  Anesthesia Plan Comments:         Anesthesia Quick Evaluation

## 2017-06-25 NOTE — Op Note (Signed)
Procedure(s): HYSTEROSCOPY WITH REMOVAL PROLAPSED FIBROID, DILATATION AND CURRATAGE Procedure Note  Lynn Ochoa female 51 y.o. 06/25/2017  Procedure(s) and Anesthesia Type:    * HYSTEROSCOPY WITH REMOVAL PROLAPSED FIBROID, DILATATION AND CURRATAGE - General  Surgeon(s) and Role:    * Kam Rahimi, Seymour Bars, MD - Primary   Indications: The patient was admitted to the hospital with a brief history of vaginal bleeding since June 1 . On evaluation today, the patient was noted to have a prolapsed uterine fibroid maintaining cervix open at approximately 4 cm. Removal of uterine fibroid recommended for management of the abnormal uterine bleeding which is now associated with a  symptomatic anemia Surgeon: Kendall Flack P   Assistants: None  Anesthesia: General endotracheal anesthesia  ASA Class: 2    Procedure Detail  HYSTEROSCOPY WITH REMOVAL PROLAPSED FIBROID, DILATATION AND CURRATAGE  Findings:  The uterus was enlarged to approximately 10 weeks size. The cervix was dilated by a 4 cm prolapsing uterine fibroid. Small amount of blood was found in the vagina coming from the endometrial cavity. Estimated Blood Loss:  Minimal         Drains: None         Blood Given: none.  Patient declined         Specimens: Prolapsed uterine fibroid and endometrial curettings         Complications:  None         Disposition: PACU - hemodynamically stable.         Condition: Stable  The patient was taken to the operating room after appropriate identification placed on the operating table. After the attainment of adequate general anesthesia she was placed in the lithotomy position. The perineum and vagina prepped with multiple layers of Betadine and her straight catheter used to empty the bladder. A timeout was performed. The perineum was draped as a sterile field. A weighted speculum was placed in the posterior vagina and the prolapsing fibroid could easily be identified. It was grasped with a  tenaculum and several stitches placed in the stalk, allowing excision of the fibroid. 2. Additional Endoloop sutures were placed around the fibroid and hemostasis was noted to be adequate. Because the cervix was so dilated a stitch of #1 Vicryl was placed in the cervix and cinched down to allow placement of the hysteroscope. The hysteroscope was then used to visualize the endometrial cavity. The stalk with Endoloop sutures was noted to be hemostatic. A 5 mm uterine fibroid seem to be present just proximal to the stalk and both tubal ostia were identified. No other lesions were noted in the endometrial cavity. The hysteroscope was removed and the cavity curetted with minimal tissue obtained. At this time. Hemostasis seemed to be adequate and the patient was awakened from general anesthesia and taken to the recovery room in satisfactory tolerated procedure well with sponge and instrument counts correct. The decision was made to observe the patient overnight in light of her significant anemia and declining transfusion.

## 2017-06-25 NOTE — Anesthesia Procedure Notes (Signed)
Procedure Name: Intubation Date/Time: 06/25/2017 8:45 PM Performed by: Jonna Munro Pre-anesthesia Checklist: Patient identified, Emergency Drugs available, Suction available, Patient being monitored and Timeout performed Patient Re-evaluated:Patient Re-evaluated prior to inductionOxygen Delivery Method: Circle system utilized Preoxygenation: Pre-oxygenation with 100% oxygen Intubation Type: IV induction, Rapid sequence and Cricoid Pressure applied Laryngoscope Size: Mac and 3 Grade View: Grade I Tube type: Oral Tube size: 7.0 mm Number of attempts: 1 Airway Equipment and Method: Stylet Placement Confirmation: ETT inserted through vocal cords under direct vision,  positive ETCO2 and breath sounds checked- equal and bilateral Secured at: 21 cm Tube secured with: Tape Dental Injury: Teeth and Oropharynx as per pre-operative assessment

## 2017-06-26 ENCOUNTER — Encounter (HOSPITAL_COMMUNITY): Payer: Self-pay | Admitting: Obstetrics and Gynecology

## 2017-06-26 DIAGNOSIS — N920 Excessive and frequent menstruation with regular cycle: Secondary | ICD-10-CM | POA: Diagnosis not present

## 2017-06-26 DIAGNOSIS — D649 Anemia, unspecified: Secondary | ICD-10-CM | POA: Diagnosis not present

## 2017-06-26 DIAGNOSIS — D259 Leiomyoma of uterus, unspecified: Secondary | ICD-10-CM | POA: Diagnosis not present

## 2017-06-26 LAB — PREPARE RBC (CROSSMATCH)

## 2017-06-26 MED ORDER — LACTATED RINGERS IV SOLN
INTRAVENOUS | Status: DC
  Administered 2017-06-26: 02:00:00 via INTRAVENOUS

## 2017-06-26 MED ORDER — HYDROCODONE-ACETAMINOPHEN 5-325 MG PO TABS: 1.0000 | ORAL_TABLET | Freq: Four times a day (QID) | ORAL | 0 refills | Status: DC | PRN

## 2017-06-26 MED ORDER — DIPHENHYDRAMINE HCL 25 MG PO CAPS
25.0000 mg | ORAL_CAPSULE | Freq: Once | ORAL | Status: AC
  Administered 2017-06-26: 25 mg via ORAL
  Filled 2017-06-26: qty 1

## 2017-06-26 MED ORDER — IBUPROFEN 600 MG PO TABS
600.0000 mg | ORAL_TABLET | Freq: Four times a day (QID) | ORAL | Status: DC
  Administered 2017-06-26 (×2): 600 mg via ORAL
  Filled 2017-06-26 (×2): qty 1

## 2017-06-26 MED ORDER — HYDROCODONE-ACETAMINOPHEN 5-325 MG PO TABS: 1.0000 | ORAL_TABLET | ORAL | Status: DC | PRN

## 2017-06-26 MED ORDER — SIMETHICONE 80 MG PO CHEW: 80.0000 mg | CHEWABLE_TABLET | Freq: Four times a day (QID) | ORAL | Status: DC | PRN

## 2017-06-26 MED ORDER — SODIUM CHLORIDE 0.9 % IV SOLN
Freq: Once | INTRAVENOUS | Status: AC
  Administered 2017-06-26: 13:00:00 via INTRAVENOUS

## 2017-06-26 MED ORDER — ACETAMINOPHEN 325 MG PO TABS
650.0000 mg | ORAL_TABLET | Freq: Once | ORAL | Status: AC
  Administered 2017-06-26: 650 mg via ORAL
  Filled 2017-06-26: qty 2

## 2017-06-26 MED ORDER — IBUPROFEN 600 MG PO TABS: ORAL_TABLET | ORAL | 0 refills | Status: DC

## 2017-06-26 NOTE — Discharge Instructions (Signed)
Call [facility] @ [phone] if:  You have a temperature greater than or equal to 100.4 degrees Farenheit orally You have pain that is not made better by the pain medication given and taken as directed You have excessive bleeding or problems urinating  Take Colace (Docusate Sodium/Stool Softener) 100 mg 2-3 times daily while taking narcotic pain medicine to avoid constipation or until bowel movements are regular.  You will receive a call from the [facility]  about receiving your iron infusions   You may drive after 24 hours You may walk up steps  You may shower  You may resume a regular diet  Avoid anything in vagina for until after your post-operative visit

## 2017-06-26 NOTE — Anesthesia Postprocedure Evaluation (Signed)
Anesthesia Post Note  Patient: Lynn Ochoa  Procedure(s) Performed: Procedure(s) (LRB): HYSTEROSCOPY WITH REMOVAL PROLAPSED FIBROID, DILATATION AND CURRATAGE (N/A)     Patient location during evaluation: [facility] Anesthesia Type: General Level of consciousness: awake and alert and oriented Pain management: pain level controlled Vital Signs Assessment: post-procedure vital signs reviewed and stable Respiratory status: spontaneous breathing and nonlabored ventilation Cardiovascular status: stable Postop Assessment: adequate PO intake and no signs of nausea or vomiting Anesthetic complications: no    Last Vitals:  Vitals:   06/26/17 0402 06/26/17 0500  BP: (!) 91/42 (!) 97/44  Pulse: 72 71  Resp: 14 16  Temp: 36.9 C 36.9 C    Last Pain:  Vitals:   06/26/17 0642  TempSrc:   PainSc: Asleep   Pain Goal:                 Jabier Mutton

## 2017-06-26 NOTE — Progress Notes (Signed)
Discharge teaching complete with pt. Pt understood all instructions and did not have any questions. Pt discharged home to family.  

## 2017-06-26 NOTE — Progress Notes (Signed)
Lynn Ochoa is a66 y.o.  [id number]  Post Op Date # 1:  Hysteroscopic Resection of Prolapsed Fibroid  Subjective: Patient is Doing well postoperatively.  Her only complaint is fatigue. Patient states, however, that she has not needed any pain medications.  Denies dizziness with walking or nausea with taking po's.   Has not had any problems voiding and has not passed flatus.   Objective: Vital signs in last 24 hours: Temp:  [97.5 F (36.4 C)-98.7 F (37.1 C)] 98.6 F (37 C) (07/03 0745) Pulse Rate:  [71-105] 74 (07/03 0753) Resp:  [14-17] 16 (07/03 0745) BP: (91-125)/(42-74) 95/42 (07/03 0753) No significant orthostatic changes SpO2:  [96 %-100 %] 98 % (07/03 0500) Weight:  [155 lb (70.3 kg)] 155 lb (70.3 kg) (07/03 0402)  Intake/Output from previous day: 07/02 0701 - 07/03 0700 In: 2000 [I.V.:2000] Out: [place] [Urine:1525]  I/O last 3 completed shifts: In: 2000 [I.V.:2000] Out: 1545 [Urine:1525; Blood:20] Total I/O In: 1297.5 [P.O.:360; I.V.:937.5] Out: 600 [Urine:600]    Recent Labs Lab 06/25/17 1914 06/26/17 0534  WBC 9.0 7.2  HGB 5.4* 4.8*  HCT 17.7* 15.6*  PLT 267 235    EXAM: General: alert, cooperative, no distress and pale Resp: clear to auscultation bilaterally Cardio: RRR, no murmur, rub or gallop GI: bowel sounds present and soft abdomen Extremities: Homans sign is negative, no sign of DVT and SCD hose in place and functioning; no calf tenderness. Vaginal Bleeding: faint dry pink stain on perineal pad   Assessment: s/p Procedure(s): HYSTEROSCOPY WITH REMOVAL PROLAPSED FIBROID, DILATATION AND CURRATAGE: stable, progressing well and anemia  Plan: Advance diet  Offered and discussed blood transfusion with the patient however,  she declines.  Offered and discussed Iron Infusions as an outpatient and the patient is willing to consider-will arrange. Pt has now changed her mind and wants to proceed with blood transfusion now that she understands iron  infusion will take time to improve fatigue and benefits from tranfusion may be more immediate.  She and her husband acknowledge no further questions beyond the extensive disscussion re risks and benefits of blood transfusion we had yesterday, and want to proceed.  Probable discharge later today  LOS: 0 days    POWELL,ELMIRA, PA-C 06/26/2017 8:04 AM

## 2017-06-27 ENCOUNTER — Inpatient Hospital Stay (HOSPITAL_COMMUNITY)
Admission: AD | Admit: 2017-06-27 | Discharge: 2017-06-27 | Disposition: A | Discharge status: Home / Self Care | Payer: Commercial Managed Care - PPO | Source: Ambulatory Visit | Attending: Obstetrics and Gynecology | Admitting: Obstetrics and Gynecology

## 2017-06-27 ENCOUNTER — Encounter (HOSPITAL_COMMUNITY): Payer: Self-pay | Admitting: *Deleted

## 2017-06-27 DIAGNOSIS — R601 Generalized edema: Secondary | ICD-10-CM | POA: Diagnosis not present

## 2017-06-27 DIAGNOSIS — R600 Localized edema: Secondary | ICD-10-CM

## 2017-06-27 LAB — BPAM RBC
BLOOD PRODUCT EXPIRATION DATE: 201807122359
BLOOD PRODUCT EXPIRATION DATE: 201807172359
Blood Product Expiration Date: 201807072359
Blood Product Expiration Date: 201807172359
ISSUE DATE / TIME: 201807031519
ISSUE DATE / TIME: 201807031255
UNIT TYPE AND RH: 600
UNIT TYPE AND RH: 6200
Unit Type and Rh: 6200
Unit Type and Rh: 9500

## 2017-06-27 LAB — CBC
HCT: 23.8 % — ABNORMAL LOW (ref 36.0–46.0)
Hemoglobin: 7.6 g/dL — ABNORMAL LOW (ref 12.0–15.0)
MCH: 27 pg (ref 26.0–34.0)
MCHC: 31.9 g/dL (ref 30.0–36.0)
MCV: 84.7 fL (ref 78.0–100.0)
Platelets: 231 10*3/uL (ref 150–400)
RBC: 2.81 MIL/uL — ABNORMAL LOW (ref 3.87–5.11)
RDW: 16.4 % — ABNORMAL HIGH (ref 11.5–15.5)
WBC: 8 10*3/uL (ref 4.0–10.5)

## 2017-06-27 LAB — TYPE AND SCREEN
ABO/RH(D): A POS
Antibody Screen: NEGATIVE
UNIT DIVISION: 0
UNIT DIVISION: 0
UNIT DIVISION: 0
Unit division: 0

## 2017-06-27 NOTE — Progress Notes (Signed)
Lori Clemmons CNM notified of pt's admission and status. Will draw CBC and provider will see pt

## 2017-06-27 NOTE — Discharge Instructions (Signed)
Edema Edema is when you have too much fluid in your body or under your skin. Edema may make your legs, feet, and ankles swell up. Swelling is also common in looser tissues, like around your eyes. This is a common condition. It gets more common as you get older. There are many possible causes of edema. Eating too much salt (sodium) and being on your feet or sitting for a long time can cause edema in your legs, feet, and ankles. Hot weather may make edema worse. Edema is usually painless. Your skin may look swollen or shiny. Follow these instructions at home:  Keep the swollen body part raised (elevated) above the level of your heart when you are sitting or lying down.  Do not sit still or stand for a long time.  Do not wear tight clothes. Do not wear garters on your upper legs.  Exercise your legs. This can help the swelling go down.  Wear elastic bandages or support stockings as told by your doctor.  Eat a low-salt (low-sodium) diet to reduce fluid as told by your doctor.  Depending on the cause of your swelling, you may need to limit how much fluid you drink (fluid restriction).  Take over-the-counter and prescription medicines only as told by your doctor. Contact a doctor if:  Treatment is not working.  You have heart, liver, or kidney disease and have symptoms of edema.  You have sudden and unexplained weight gain. Get help right away if:  You have shortness of breath or chest pain.  You cannot breathe when you lie down.  You have pain, redness, or warmth in the swollen areas.  You have heart, liver, or kidney disease and get edema all of a sudden.  You have a fever and your symptoms get worse all of a sudden. Summary  Edema is when you have too much fluid in your body or under your skin.  Edema may make your legs, feet, and ankles swell up. Swelling is also common in looser tissues, like around your eyes.  Raise (elevate) the swollen body part above the level of your  heart when you are sitting or lying down.  Follow your doctor's instructions about diet and how much fluid you can drink (fluid restriction). This information is not intended to replace advice given to you by your health care provider. Make sure you discuss any questions you have with your health care provider. Document Released: 05/29/2008 Document Revised: 12/29/2016 Document Reviewed: 12/29/2016 Elsevier Interactive Patient Education  2017 [employer] Inc[employer]  

## 2017-06-27 NOTE — Progress Notes (Signed)
Lori Clemmons CNM in to discuss test results and d/c plan. Written and verbal d/c instructions given and understanding voiced.

## 2017-06-27 NOTE — MAU Provider Note (Signed)
History     CSN: [id number]  Arrival date & time 06/27/17  1114   None     Chief Complaint  Patient presents with  . Post-op Problem    HPI Pt arrives at MAU stating that she had a fibroid removed 2 days ago and received a blood transfusion for a hgb of 4.8. Today her hgb is 7.6. hgb stable. WBC 8.0. C/o of swelling in her face , hands and feet and tingling in her hands and feet. Denies itching, difficulty breathing. Taking  OTC tylnol and ibuprofen. Bleeding -only spotting. VS stable Past Medical History:  Diagnosis Date  . Anemia   . Breast adenoma 2010  . Hemorrhoid   . Mastodynia 2010  . Simple ovarian cyst    left   . SVD (spontaneous vaginal delivery)    x 2  . Thrombocytopenia ([facility])    hx of    Past Surgical History:  Procedure Laterality Date  . HYSTEROSCOPY WITH RESECTOSCOPE N/A 06/25/2017   Procedure: HYSTEROSCOPY WITH REMOVAL PROLAPSED FIBROID, DILATATION AND CURRATAGE;  Surgeon: Eldred Manges, MD;  Location: [facility];  Service: Gynecology;  Laterality: N/A;    Family History  Problem Relation Age of Onset  . Heart disease Mother   . Hypotension Mother   . Hypertension Father   . Heart disease Father     Social History  Substance Use Topics  . Smoking status: Never Smoker  . Smokeless tobacco: Never Used  . Alcohol use No    OB History    Gravida Para Term Preterm AB Living   2 2 2     2    SAB TAB Ectopic Multiple Live Births           2      Review of Systems  Cardiovascular:       Swelling in face,hands,feet  Neurological: Positive for numbness.    Allergies  Patient has no known allergies.  Home Medications    BP 114/61   Pulse 74   Temp 98.1 F (36.7 C)   Resp 18   Ht 5\' 2"  (1.575 m)   Wt 159 lb (72.1 kg)   SpO2 100%   BMI 29.08 kg/m   Physical Exam  Constitutional: She is oriented to person, place, and time. She appears well-developed and well-nourished. No distress.  HENT:  Head: Normocephalic and atraumatic.   Swelling in face per pt  Cardiovascular: Normal rate and regular rhythm.   Pulmonary/Chest: Effort normal. No respiratory distress.  Abdominal: Soft.  Musculoskeletal: Normal range of motion. She exhibits edema.  Slight swelling in feet and hands; nonpitting  Neurological: She is alert and oriented to person, place, and time.  Skin: Skin is warm and dry.  Psychiatric: She has a normal mood and affect. Her behavior is normal.  Nursing note and vitals reviewed.   MAU Course  Procedures (including critical care time)  Labs Reviewed  CBC - Abnormal; Notable for the following:       Result Value   RBC 2.81 (*)    Hemoglobin 7.6 (*)    HCT 23.8 (*)    RDW 16.4 (*)    All other components within normal limits   No results found. Results for orders placed or performed during the hospital encounter of 06/27/17 (from the past 24 hour(s))  CBC     Status: Abnormal   Collection Time: 06/27/17 11:53 AM  Result Value Ref Range   WBC 8.0 4.0 - 10.5 K/uL  RBC 2.81 (L) 3.87 - 5.11 MIL/uL   Hemoglobin 7.6 (L) 12.0 - 15.0 g/dL   HCT 23.8 (L) 36.0 - 46.0 %   MCV 84.7 78.0 - 100.0 fL   MCH 27.0 26.0 - 34.0 pg   MCHC 31.9 30.0 - 36.0 g/dL   RDW 16.4 (H) 11.5 - 15.5 %   Platelets 231 150 - 400 K/uL    No diagnosis found.    MDM  CBC obtained with stable results. Reassurred pt that swelling was probably coming from IVF and blood transfusion that she received yesterday. Told to call the office and make follow up appointment if not better in 2-3 days. Reassurred. Pt happy with plan to go home.   1. Extremity edema 2. Continue postop instructions 3. Call office for an appointment if symptoms persist or worsen. Yvonne Kendall CNM

## 2017-06-27 NOTE — MAU Note (Addendum)
Since 0400 have noticed face and eyelids are swollen and having neck pain. Pain is constant in neck whether still or moving head and neck. No problems related to surgery on Monday. Also this am feel "numb" both arms and feet

## 2017-06-29 LAB — CBC
HCT: 15.6 % — ABNORMAL LOW (ref 36.0–46.0)
Hemoglobin: 4.8 g/dL — CL (ref 12.0–15.0)
MCH: 26.2 pg (ref 26.0–34.0)
MCHC: 30.8 g/dL (ref 30.0–36.0)
MCV: 85.2 fL (ref 78.0–100.0)
PLATELETS: 235 10*3/uL (ref 150–400)
RBC: 1.83 MIL/uL — AB (ref 3.87–5.11)
RDW: 17.5 % — AB (ref 11.5–15.5)
WBC: 7.2 10*3/uL (ref 4.0–10.5)

## 2017-07-04 NOTE — Discharge Summary (Signed)
Physician Discharge Summary  Patient ID: Lynn Ochoa MRN: [id number] DOB/AGE: June 12, 1966 51 y.o.  Admit date: 06/25/2017 Discharge date: 07/04/2017  Admission Diagnoses:   Submucous uterine fibroid with prolapse   Menorrhagia   Anemia   Discharge Diagnoses:  Submucous uterine fibroid with prolapse, s/p resection   Menorrhagia, resolved s/p resection   Anemia, s/p transfusion   Discharged Condition: good  Hospital Course: The patient was placed in observation after transvaginal myoma resection and hysteroscopy which the patient tolerated well.  Post op hemoglobin was 4.8 and patient c/o fatigue, though she had no dizziness or orthostatic BP changes.  She had previously declined blood transfusion in spite of her significant anemia.  On post op Day #1, the pt accepted blood transfusion and was transfused 2 units without difficulty.  She remained afebrile with stable vital signs, ambulating well, with minimal vaginal bleeding and was discharged home after transfusion.  The patient had previously expressed a desire for iron infusion but changed her mind in favor of transfusion after a discussion of the indications, risks, benefits and expected outcome of each option.  Consults: None  Significant Diagnostic Studies: labs: cbc  Treatments: IV hydration, antibiotics: CEFOTETAN prophylactically for surgery and surgery: resection of fibroid and hysteroscopy  Discharge Exam: Blood pressure (!) 89/45, pulse 82, temperature 98.2 F (36.8 C), temperature source Oral, resp. rate 18, height 5\' 2"  (1.575 m), weight 155 lb (70.3 kg), SpO2 99 %. General appearance: alert, cooperative and pale GI: soft, non-tender; bowel sounds normal; no masses,  no organomegaly  Vaginal:  Minimal bleeding  Disposition: 01-Home or Self Care   Allergies as of 06/26/2017   No Known Allergies     Medication List    TAKE these medications   ibuprofen 600 MG tablet Commonly known as:  ADVIL,MOTRIN 1 po pc every 6  hours for 5 days then prn-pain      Follow-up Information    Ingra Rother, Seymour Bars, MD Follow up on 07/12/2017.   Specialty:  Obstetrics and Gynecology Why:  appointment time is 3:45 p.m.;  You will receive a call from [facility] to schedule a time for your iron infusions. Contact information: [address] [place] [address] [phone]           Signed: Eldred Manges 07/04/2017, 11:14 AM

## 2017-07-12 DIAGNOSIS — N920 Excessive and frequent menstruation with regular cycle: Secondary | ICD-10-CM | POA: Diagnosis not present

## 2017-07-12 DIAGNOSIS — D649 Anemia, unspecified: Secondary | ICD-10-CM | POA: Diagnosis not present

## 2017-08-29 DIAGNOSIS — N912 Amenorrhea, unspecified: Secondary | ICD-10-CM | POA: Diagnosis not present

## 2017-08-29 DIAGNOSIS — Z01411 Encounter for gynecological examination (general) (routine) with abnormal findings: Secondary | ICD-10-CM | POA: Diagnosis not present

## 2017-08-29 DIAGNOSIS — Z1231 Encounter for screening mammogram for malignant neoplasm of breast: Secondary | ICD-10-CM | POA: Diagnosis not present

## 2017-08-29 DIAGNOSIS — D649 Anemia, unspecified: Secondary | ICD-10-CM | POA: Diagnosis not present

## 2018-01-16 DIAGNOSIS — N925 Other specified irregular menstruation: Secondary | ICD-10-CM | POA: Diagnosis not present

## 2018-01-16 DIAGNOSIS — R102 Pelvic and perineal pain: Secondary | ICD-10-CM | POA: Diagnosis not present

## 2018-01-16 DIAGNOSIS — D259 Leiomyoma of uterus, unspecified: Secondary | ICD-10-CM | POA: Diagnosis not present

## 2018-03-13 IMAGING — MR MR HEAD WO/W CM
10 series · 48 of 48 positions shown · IV contrast (15ml multihance)
Comparison: None.

CLINICAL DATA: Right posterior daily headaches for 3 months.
Numbness in both arms right greater than left. Maternal history of
brain tumor.

EXAM:
MRI HEAD WITHOUT AND WITH CONTRAST
TECHNIQUE: Multiplanar, multiecho pulse sequences of the brain and surrounding
structures were obtained without and with intravenous contrast.
CONTRAST:  15mL MULTIHANCE GADOBENATE DIMEGLUMINE 529 MG/ML IV SOLN

[Series 2: T1 · sagittal · 5.0mm · 0.45mm/px · 2 of 21 slices shown]
[im 1/21]
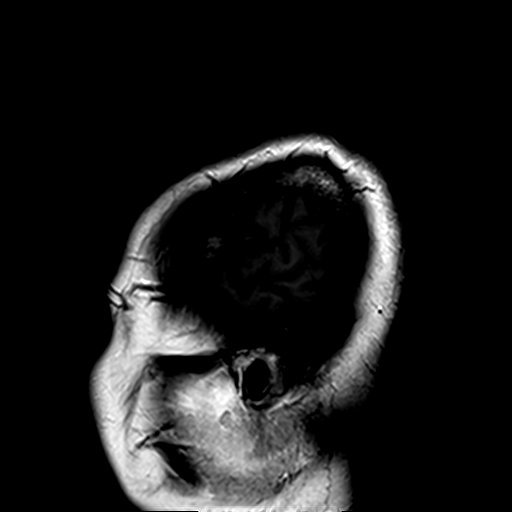
[im 21/21]
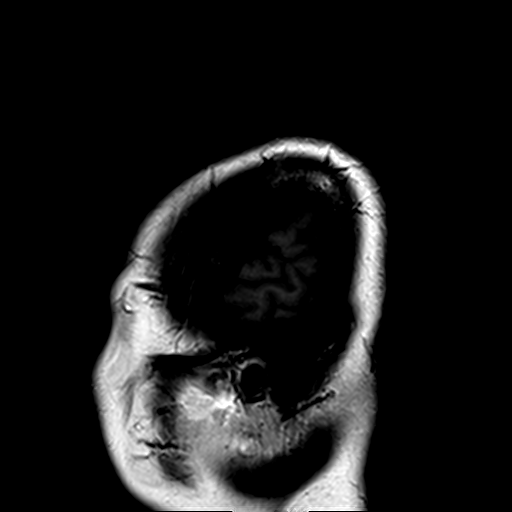

[Series 3: DWI · axial · 3.0mm · 1.80mm/px · z∈[-57,+90]mm · 8 of 100 slices shown (1 of 2)]
[im 1/100]
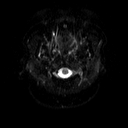
[im 15/100]
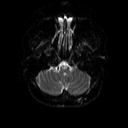
[im 29/100]
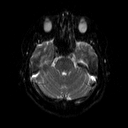
[im 43/100]
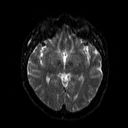
[im 57/100]
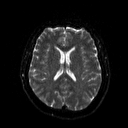
[im 71/100]
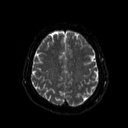
[im 85/100]
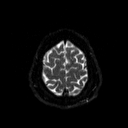
[im 100/100]
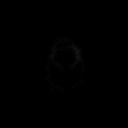

[Series 4: DWI · axial · 3.0mm · 1.80mm/px · z∈[-57,+90]mm · 4 of 49 slices shown (2 of 2)]
[im 1/49]
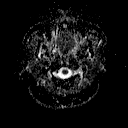
[im 17/49]
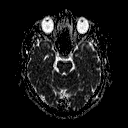
[im 33/49]
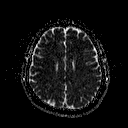
[im 49/49]
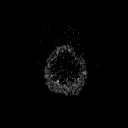

[Series 5: T2 · axial · 5.0mm · 0.51mm/px · z∈[-55,+87]mm · 2 of 22 slices shown (1 of 2)]
[im 1/22]
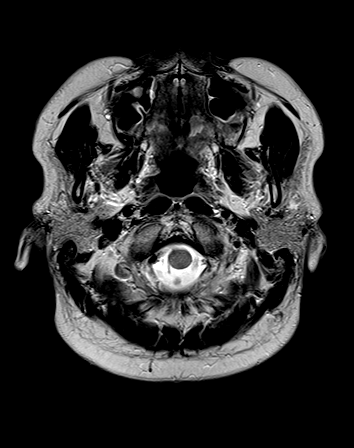
[im 22/22]
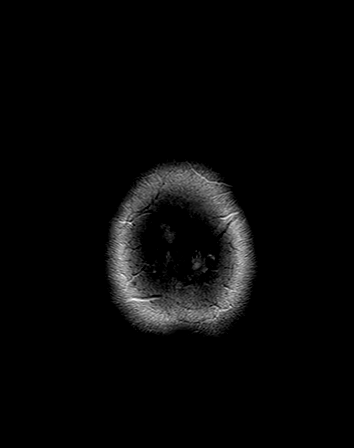

[Series 6: FLAIR · axial · 3.0mm · 0.45mm/px · z∈[-53,+85]mm · 2 of 24 slices shown]
[im 1/24]
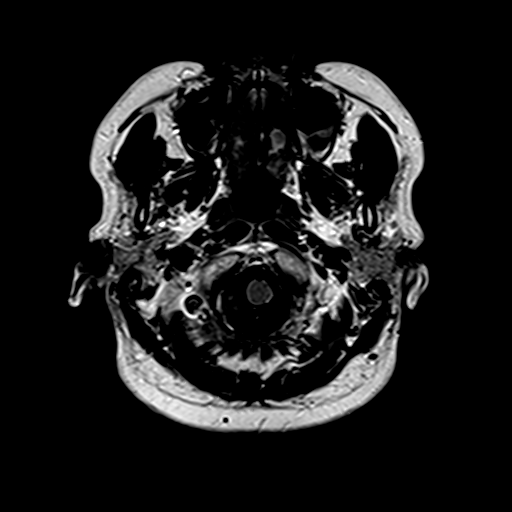
[im 24/24]
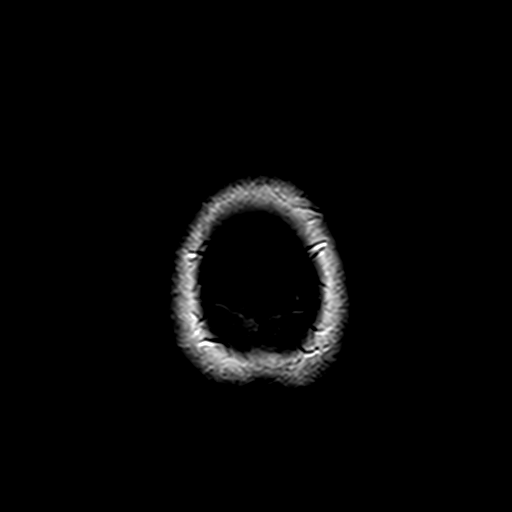

[Series 8: swi_images · axial · 5.0mm · 0.90mm/px · z∈[-56,+89]mm · 2 of 30 slices shown]
[im 1/30]
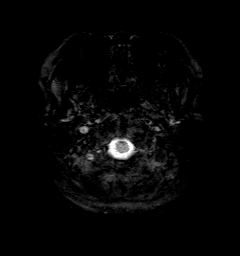
[im 30/30]
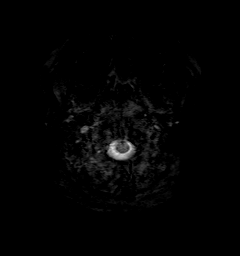

[Series 9: t1_mpr_tra · axial · 1.0mm · 0.75mm/px · z∈[-56,+87]mm · 12 of 144 slices shown (1 of 2)]
[im 1/144]
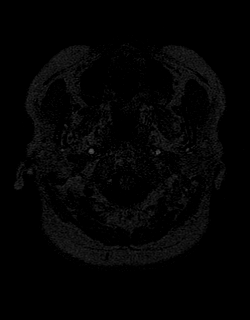
[im 14/144]
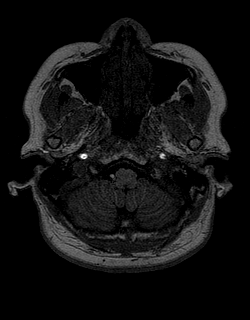
[im 27/144]
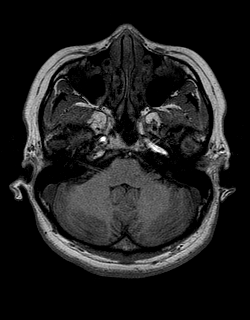
[im 40/144]
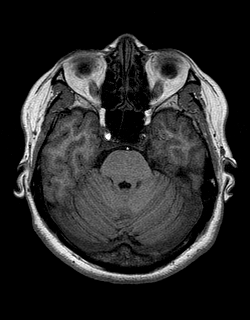
[im 53/144]
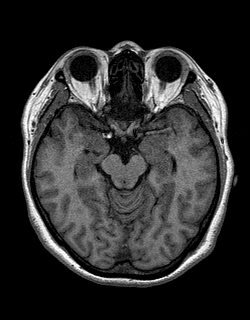
[im 66/144]
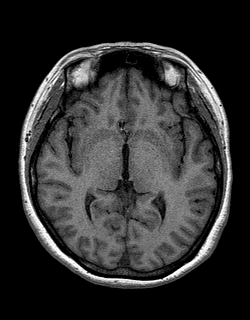
[im 79/144]
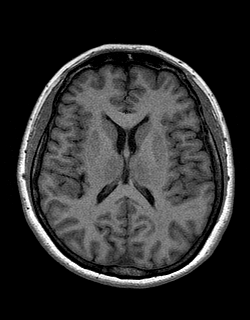
[im 92/144]
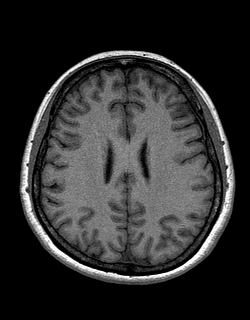
[im 105/144]
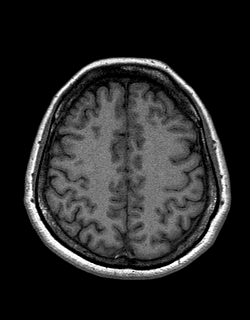
[im 118/144]
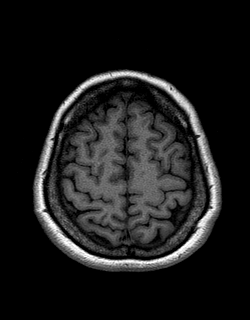
[im 131/144]
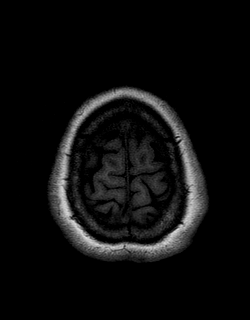
[im 144/144]
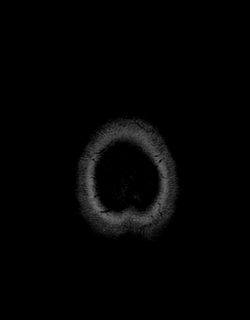

[Series 10: T2 · coronal · 5.0mm · 0.45mm/px · 2 of 25 slices shown (2 of 2)]
[im 1/25]
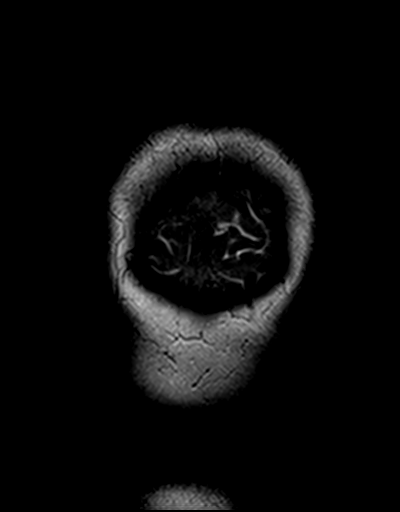
[im 25/25]
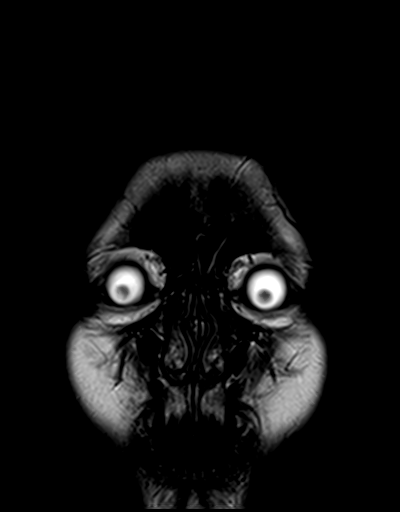

[Series 11: t1_mpr_tra · axial · 1.0mm · 0.75mm/px · z∈[-56,+87]mm · 12 of 144 slices shown (2 of 2)]
[im 1/144]
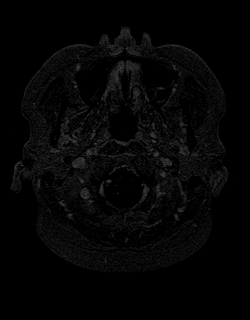
[im 14/144]
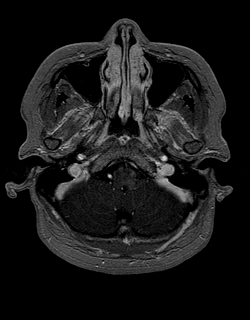
[im 27/144]
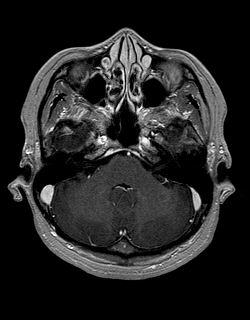
[im 40/144]
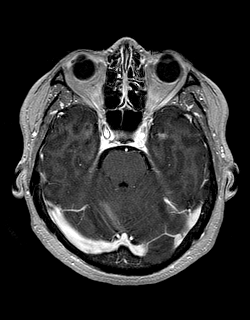
[im 53/144]
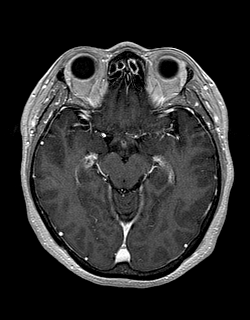
[im 66/144]
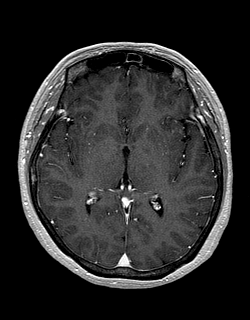
[im 79/144]
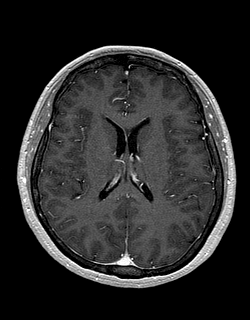
[im 92/144]
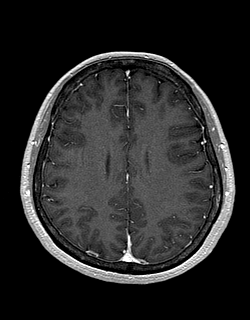
[im 105/144]
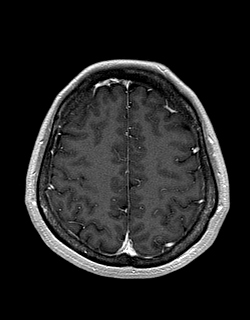
[im 118/144]
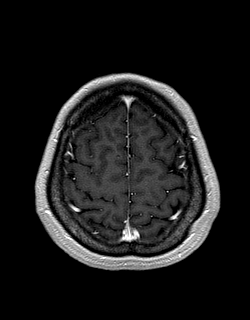
[im 131/144]
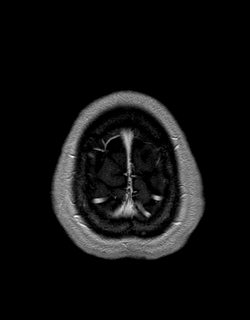
[im 144/144]
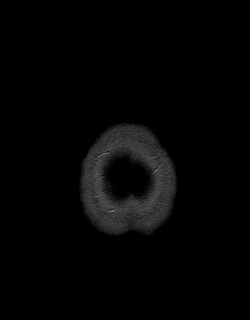

[Series 12: post cor · coronal · 5.0mm · 0.45mm/px · 2 of 25 slices shown]
[im 1/25]
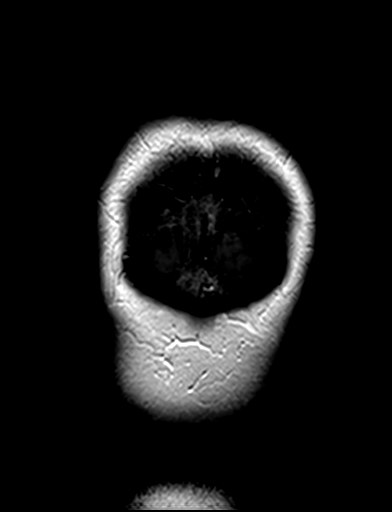
[im 25/25]
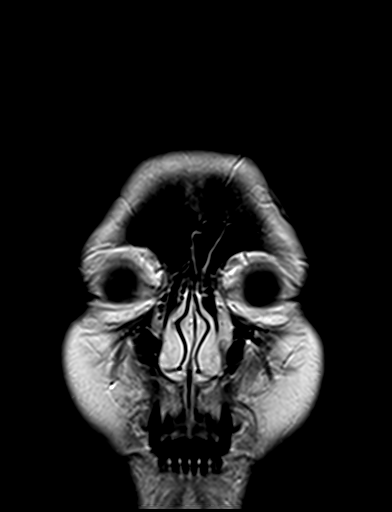

[48 of 48 positions shown; findings below may reference images not displayed]

FINDINGS: Brain: No acute infarct, hemorrhage, or mass lesion is present. The
ventricles are of normal size. Minimal white matter changes are
within normal limits for age. No other significant white matter
disease is present. The brainstem and cerebellum are normal. The
internal auditory canals are within normal limits bilaterally.

Postcontrast images demonstrate no pathologic enhancement.

Vascular: Flow is present in the major intracranial arteries.

Skull and upper cervical spine: The skullbase is within normal
limits. Midline sagittal structures are unremarkable. The
craniocervical junction is normal.

Sinuses/Orbits: Mild mucosal thickening is present in the maxillary
sinuses, anterior ethmoid air cells, and frontal sinuses
bilaterally. There is minimal mucosal thickening in the right
sphenoid sinus. Mastoid air cells are clear.
IMPRESSION: 1. Normal MRI appearance of the brain. No acute or focal lesion to
explain the patient's symptoms. No evidence brain tumor.
2. Mild diffuse sinus disease appears

## 2018-08-05 DIAGNOSIS — R51 Headache: Secondary | ICD-10-CM | POA: Diagnosis not present

## 2018-08-05 DIAGNOSIS — R209 Unspecified disturbances of skin sensation: Secondary | ICD-10-CM | POA: Diagnosis not present

## 2018-08-05 DIAGNOSIS — R5383 Other fatigue: Secondary | ICD-10-CM | POA: Diagnosis not present

## 2018-08-19 NOTE — Progress Notes (Signed)
Office Visit Note  Patient: Lynn Ochoa             Date of Birth: 04/26/1966           MRN: [id number]             PCP: Eldred Manges, MD Referring: Chipper Herb Family Jerilynn Mages* Visit Date: 08/20/2018 Occupation: @GUAROCC @  Subjective:  Headache and numbness in hands and feet.Marland Kitchen   History of Present Illness: Lynn Ochoa is a 52 y.o. female seen in consultation per request of her PCP.  According to patient about 2 years ago she started experiencing pain in her head which she describes over occipital area.  She states she was told to take ibuprofen.  She states that the symptoms persist she had MRI of her brain which was normal.  Although she has recurrent headache.  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  She has been also experiencing numbness in her both hands and sometimes in her feet for the last 2 months.  She is quite concerned about her symptoms.  She denies any neck pain or discomfort.  She had problems with left shoulder joint tendinopathy in the past which is resolved now.  None of her joints are painful or swollen.  She has been experiencing some popping sensation in her right knee joint which does not bother her much.  She also gives history of insomnia and snoring at night.  She denies any history of allergies or sinusitis.  Activities of Daily Living:  Patient reports morning stiffness for 0 minute.   Patient Denies nocturnal pain.  Difficulty dressing/grooming: Denies Difficulty climbing stairs: Denies Difficulty getting out of chair: Denies Difficulty using hands for taps, buttons, cutlery, and/or writing: Denies  Review of Systems  Constitutional: Positive for fatigue. Negative for night sweats, weight gain and weight loss.  HENT: Negative for mouth sores, trouble swallowing, trouble swallowing, mouth dryness and nose dryness.   Eyes: Negative for pain, redness, visual disturbance and dryness.  Respiratory: Negative for cough, shortness of breath  and difficulty breathing.   Cardiovascular: Negative for chest pain, palpitations, hypertension, irregular heartbeat and swelling in legs/feet.  Gastrointestinal: Negative for blood in stool, constipation and diarrhea.  Endocrine: Negative for increased urination.  Genitourinary: Negative for vaginal dryness.  Musculoskeletal: Negative for arthralgias, joint pain, joint swelling, myalgias, muscle weakness, morning stiffness, muscle tenderness and myalgias.  Skin: Negative for color change, rash, hair loss, skin tightness, ulcers and sensitivity to sunlight.  Allergic/Immunologic: Negative for susceptible to infections.  Neurological: Positive for headaches. Negative for dizziness, memory loss, night sweats and weakness.  Hematological: Negative for swollen glands.  Psychiatric/Behavioral: Positive for sleep disturbance. Negative for depressed mood. The patient is not nervous/anxious.        Snore    PMFS History:  Patient Active Problem List   Diagnosis Date Noted  . Submucous uterine fibroid 06/25/2017  . Menorrhagia 06/25/2017  . Anemia 06/25/2017  . Other and unspecified ovarian cyst 11/20/2012    Past Medical History:  Diagnosis Date  . Anemia   . Breast adenoma 2010  . Hemorrhoid   . Mastodynia 2010  . Simple ovarian cyst    left   . SVD (spontaneous vaginal delivery)    x 2  . Thrombocytopenia (HCC)    hx of    Family History  Problem Relation Age of Onset  . Heart disease Mother   . Hypotension Mother   . Cancer  Mother   . Hypertension Father   . Heart disease Father    Past Surgical History:  Procedure Laterality Date  . HYSTEROSCOPY WITH RESECTOSCOPE N/A 06/25/2017   Procedure: HYSTEROSCOPY WITH REMOVAL PROLAPSED FIBROID, DILATATION AND CURRATAGE;  Surgeon: Eldred Manges, MD;  Location: [facility];  Service: Gynecology;  Laterality: N/A;   Social History   Social History Narrative  . Not on file    Objective: Vital Signs: BP (!) 100/57 (BP Location:  Right Arm, Patient Position: Sitting, Cuff Size: Small)   Pulse 72   Resp 14   Ht 5' 3.5" (1.613 m)   Wt 155 lb (70.3 kg)   BMI 27.03 kg/m    Physical Exam  Constitutional: She is oriented to person, place, and time. She appears well-developed and well-nourished.  HENT:  Head: Normocephalic and atraumatic.  Eyes: Conjunctivae and EOM are normal.  Neck: Normal range of motion.  Cardiovascular: Normal rate, regular rhythm, normal heart sounds and intact distal pulses.  Pulmonary/Chest: Effort normal and breath sounds normal.  Abdominal: Soft. Bowel sounds are normal.  Lymphadenopathy:    She has no cervical adenopathy.  Neurological: She is alert and oriented to person, place, and time.  She had tenderness onpalpation of right occipital region.  Skin: Skin is warm and dry. Capillary refill takes less than 2 seconds.  Psychiatric: She has a normal mood and affect. Her behavior is normal.  Nursing note and vitals reviewed.    Musculoskeletal Exam: C-spine thoracic lumbar spine good range of motion.  Shoulder joints, elbow joints, wrist joints, MCPs PIPs and DIPs were in good range of motion with no synovitis.  Hip joints, knee joints, ankles, MTPs PIPs DIPs were in good range of motion with no synovitis.  CDAI Exam: CDAI Score: Not documented Patient Global Assessment: Not documented; Provider Global Assessment: Not documented Swollen: Not documented; Tender: Not documented Joint Exam   Not documented   There is currently no information documented on the homunculus. Go to the Rheumatology activity and complete the homunculus joint exam.  Investigation: Findings:  08/05/18: Ferritin 46.7, TSH 2.61, Sed rate 18, ANA negative, CMP-glucose 104, all other labs WNL, CBC WNL 09/26/2008: HBsAg negative, Hep B core antibody negative, Hep C antibody negative   Imaging: [place] [facility]  Result Date: 08/20/2018 Ultrasound examination of bilateral hands was performed per EULAR  recommendations. Using 12 MHz transducer, grayscale and power Doppler bilateral bilateral median nerves were measured by ultrasound examination. Right median nerve was 0.09 cm squares which was within normal limits and left median nerve was 0.08 cm squares which was within normal limits. Impression: On ultrasound examination, bilateral median nerves are within normal limits.   Recent Labs: Lab Results  Component Value Date   WBC 8.0 06/27/2017   HGB 7.6 (L) 06/27/2017   PLT 231 06/27/2017    Speciality Comments: No specialty comments available.  Procedures:  No procedures performed Allergies: Patient has no known allergies.   Assessment / Plan:     Visit Diagnoses: Paresthesias/numbness -patient complains of paresthesias in her hands and feet.  She states the paresthesias are more prominent in her hands and especially in the morning.  She also has numbness in her hands when she drives.  Her manual compression test and the Phalen's test was positive today.  Although on ultrasound examination bilateral median nerves are within normal limits.  She is on computer for several hours a day as she works as a Engineer, maintenance (IT).  I have given  her prescription for right carpal tunnel syndrome brace which may be helpful.  I have also referred her to neurology for obtaining nerve conduction velocities of both upper and lower extremities due to symptoms of paresthesias.  She has an appointment coming up with neurology in October.  r8/12/19: ANA negative, sed rate 18 - Plan: Ambulatory referral to Neurology, [place] [facility]  Occipital pain-she had tenderness on palpation of her right occipital region.  She states the pain is more prominent at nighttime when she is laying on the pillow.  I assume that the discomfort is probably coming from muscular tension as she is on computer for a long time.  I also reviewed her MRI from the past which was unremarkable.  There was some question about chronic sinusitis.  Patient  states that she is asymptomatic.  She did not have any tenderness on palpation of sinuses today.  Other fatigue-relates to insomnia.  Other insomnia-patient gives history of chronic insomnia.  Snoring-she has been complaining of snoring at night.  She will benefit from sleep study.  Have advised her to discuss that further with neurologist when she goes for her appointment.  Pulmonary nodule -followed up by her PCP.  On the CT scan it was noted to be stable over the last 2 years.  Orders: Orders Placed This Encounter  Procedures  . [place] [facility]  . Ambulatory referral to Neurology   No orders of the defined types were placed in this encounter.   Face-to-face time spent with patient was 50 minutes. Greater than 50% of time was spent in counseling and coordination of care.  Follow-Up Instructions: Return for parasthesia .   Bo Merino, MD  Note - This record has been created using Editor, commissioning.  Chart creation errors have been sought, but may not always  have been located. Such creation errors do not reflect on  the standard of medical care.

## 2018-08-20 ENCOUNTER — Encounter (INDEPENDENT_AMBULATORY_CARE_PROVIDER_SITE_OTHER): Payer: Self-pay

## 2018-08-20 ENCOUNTER — Encounter: Payer: Self-pay | Admitting: Rheumatology

## 2018-08-20 ENCOUNTER — Ambulatory Visit (INDEPENDENT_AMBULATORY_CARE_PROVIDER_SITE_OTHER): Payer: 59 | Admitting: Rheumatology

## 2018-08-20 ENCOUNTER — Ambulatory Visit (INDEPENDENT_AMBULATORY_CARE_PROVIDER_SITE_OTHER): Payer: Self-pay

## 2018-08-20 VITALS — BP 100/57 | HR 72 | Resp 14 | Ht 63.5 in | Wt 155.0 lb

## 2018-08-20 DIAGNOSIS — R5383 Other fatigue: Secondary | ICD-10-CM

## 2018-08-20 DIAGNOSIS — R209 Unspecified disturbances of skin sensation: Secondary | ICD-10-CM

## 2018-08-20 DIAGNOSIS — R51 Headache: Secondary | ICD-10-CM | POA: Diagnosis not present

## 2018-08-20 DIAGNOSIS — R911 Solitary pulmonary nodule: Secondary | ICD-10-CM

## 2018-08-20 DIAGNOSIS — IMO0001 Reserved for inherently not codable concepts without codable children: Secondary | ICD-10-CM

## 2018-08-20 DIAGNOSIS — R0683 Snoring: Secondary | ICD-10-CM

## 2018-08-20 DIAGNOSIS — G4709 Other insomnia: Secondary | ICD-10-CM

## 2018-08-20 DIAGNOSIS — R519 Headache, unspecified: Secondary | ICD-10-CM

## 2018-08-29 ENCOUNTER — Encounter: Payer: Self-pay | Admitting: Neurology

## 2018-08-29 ENCOUNTER — Ambulatory Visit (INDEPENDENT_AMBULATORY_CARE_PROVIDER_SITE_OTHER): Payer: 59 | Admitting: Neurology

## 2018-08-29 VITALS — BP 118/69 | HR 71 | Ht 63.5 in | Wt 156.2 lb

## 2018-08-29 DIAGNOSIS — R202 Paresthesia of skin: Secondary | ICD-10-CM

## 2018-08-29 DIAGNOSIS — IMO0002 Reserved for concepts with insufficient information to code with codable children: Secondary | ICD-10-CM

## 2018-08-29 DIAGNOSIS — G43709 Chronic migraine without aura, not intractable, without status migrainosus: Secondary | ICD-10-CM | POA: Diagnosis not present

## 2018-08-29 NOTE — Progress Notes (Signed)
PATIENT: Lynn Ochoa DOB: 1966-04-08  Chief Complaint  Patient presents with  . Headache    Reports daily, right-sided headaches.  The pain is worse at night when she lies down.  She only takes ibuprofen when the pain is severe.    . Numbness    She has concerns over numbness in her bilateral hands and feet.   . Rheumatology    Bo Merino, MD   . PCP    Yaakov Guthrie     HISTORICAL  Lynn Ochoa is a 52 years old, seen in request by her rheumatologist Dr. Bo Merino, , and her primary care physician Dr. Yaakov Guthrie P for evaluation of headaches, paresthesia, initial evaluation was on August 29, 2018.  I have reviewed and summarized the referring note from the referring physician, she had past medical history of thyroid nodule, trans-vagina hysterectomy on June 25, 2017, postsurgical hemoglobin was 4.8, required blood transfusion  She noticed bilateral hands paresthesia since August 2019, most noticeable when she gets up in the morning time, gradually improved through out the day, right worse than left, also noticed weak grip, difficulty opening jars.  She also noticed recent onset, intermittent bilateral feet paresthesia.  She denies neck pain, no gait abnormality, no incontinence.   She complains of mild bilateral hands swelling, especially at morning time, difficulty to make a tight grip, no wrist pain  Laboratory evaluations on August 05, 2018, ferritin level was 46.7, normal TSH 2.61 ESR 18, creatinine 0.8, normal CMP, CBC hemoglobin of 14.1,  Personally reviewed MRI of the brain with without contrast on April 15, 18 was normal.  Mild diffuse sinus disease.   REVIEW OF SYSTEMS: Full 14 system review of systems performed and notable only for fatigue, wheezing, snoring, feeling hot, loss, headaches, numbness, weakness, dizziness, insomnia, snoring, not enough sleep, decreased energy, All other review of systems were negative.  ALLERGIES: No Known  Allergies  HOME MEDICATIONS: Current Outpatient Medications  Medication Sig Dispense Refill  . cholecalciferol (VITAMIN D) 1000 units tablet Take 1,000 Units by mouth daily.    . Cyanocobalamin (VITAMIN B-12 PO) Take by mouth daily.    Marland Kitchen ibuprofen (ADVIL,MOTRIN) 200 MG tablet Take 200 mg by mouth as needed.    Marland Kitchen KRILL OIL PO Take by mouth daily.     No current facility-administered medications for this visit.     PAST MEDICAL HISTORY: Past Medical History:  Diagnosis Date  . Anemia   . Breast adenoma 2010  . Fibroids    uterine  . Headache   . Hemorrhoid   . Mastodynia 2010  . Simple ovarian cyst    left   . SVD (spontaneous vaginal delivery)    x 2  . Thrombocytopenia ([place])    hx of    PAST SURGICAL HISTORY: Past Surgical History:  Procedure Laterality Date  . HYSTEROSCOPY WITH RESECTOSCOPE N/A 06/25/2017   Procedure: HYSTEROSCOPY WITH REMOVAL PROLAPSED FIBROID, DILATATION AND CURRATAGE;  Surgeon: Eldred Manges, MD;  Location: [facility];  Service: Gynecology;  Laterality: N/A;    FAMILY HISTORY: Family History  Problem Relation Age of Onset  . Heart disease Mother   . Hypotension Mother   . Brain cancer Mother   . Hypertension Father   . Heart disease Father     SOCIAL HISTORY: Social History   Socioeconomic History  . Marital status: Married    Spouse name: Not on file  . Number of children: 2  . Years of education: college  .  Highest education level: Master's degree (e.g., MA, MS, MEng, MEd, MSW, MBA)  Occupational History  . Occupation: Optometrist  Social Needs  . Financial resource strain: Not on file  . Food insecurity:    Worry: Not on file    Inability: Not on file  . Transportation needs:    Medical: Not on file    Non-medical: Not on file  Tobacco Use  . Smoking status: Never Smoker  . Smokeless tobacco: Never Used  Substance and Sexual Activity  . Alcohol use: No  . Drug use: No  . Sexual activity: Yes    Birth control/protection:  Condom  Lifestyle  . Physical activity:    Days per week: Not on file    Minutes per session: Not on file  . Stress: Not on file  Relationships  . Social connections:    Talks on phone: Not on file    Gets together: Not on file    Attends religious service: Not on file    Active member of club or organization: Not on file    Attends meetings of clubs or organizations: Not on file    Relationship status: Not on file  . Intimate partner violence:    Fear of current or ex partner: Not on file    Emotionally abused: Not on file    Physically abused: Not on file    Forced sexual activity: Not on file  Other Topics Concern  . Not on file  Social History Narrative   Lives at home with her husband.   Right-handed.   1 cup caffeine daily.     PHYSICAL EXAM   Vitals:   08/29/18 1240  BP: 118/69  Pulse: 71  Weight: 156 lb 4 oz (70.9 kg)  Height: 5' 3.5" (1.613 m)    Not recorded      Body mass index is 27.24 kg/m.  PHYSICAL EXAMNIATION:  Gen: NAD, conversant, well nourised, obese, well groomed                     Cardiovascular: Regular rate rhythm, no peripheral edema, warm, nontender. Eyes: Conjunctivae clear without exudates or hemorrhage Neck: Supple, no carotid bruits. Pulmonary: Clear to auscultation bilaterally   NEUROLOGICAL EXAM:  MENTAL STATUS: Speech:    Speech is normal; fluent and spontaneous with normal comprehension.  Cognition:     Orientation to time, place and person     Normal recent and remote memory     Normal Attention span and concentration     Normal Language, naming, repeating,spontaneous speech     Fund of knowledge   CRANIAL NERVES: CN II: Visual fields are full to confrontation. Pupils are round equal and briskly reactive to light. CN III, IV, VI: extraocular movement are normal. No ptosis. CN V: Facial sensation is intact to pinprick in all 3 divisions bilaterally. Corneal responses are intact.  CN VII: Face is symmetric with  normal eye closure and smile. CN VIII: Hearing is normal to rubbing fingers CN IX, X: Palate elevates symmetrically. Phonation is normal. CN XI: Head turning and shoulder shrug are intact CN XII: Tongue is midline with normal movements and no atrophy.  MOTOR: There is no pronator drift of out-stretched arms. Muscle bulk and tone are normal. Muscle strength is normal. She has mild bilateral wrist Tinel signs.  REFLEXES: Reflexes are 2+ and symmetric at the biceps, triceps, knees, and ankles. Plantar responses are flexor.  SENSORY: Intact to light touch, pinprick, positional sensation and  vibratory sensation are intact in fingers and toes.  COORDINATION: Rapid alternating movements and fine finger movements are intact. There is no dysmetria on finger-to-nose and heel-knee-shin.    GAIT/STANCE: Posture is normal. Gait is steady with normal steps, base, arm swing, and turning. Heel and toe walking are normal. Tandem gait is normal.  Romberg is absent.   DIAGNOSTIC DATA (LABS, IMAGING, TESTING) - I reviewed patient records, labs, notes, testing and imaging myself where available.   ASSESSMENT AND PLAN  Lynn Ochoa is a 52 y.o. female   Bilateral hands paresthesia  EMG/NCS  Check B12.  Most suggestive of bilateral carpal tunnel syndrome.   Wrist Splint  NSAIDs prn.   Marcial Pacas, M.D. Ph.D.  [facility] [address], [place], [place] [address] Ph: [phone] Fax: [phone]  CC:  Bo Merino, MD, College,  Vernie Shanks, MD

## 2018-08-29 NOTE — Patient Instructions (Addendum)
Magnesium oxide 400 mg twice a day Riboflavin= vitamin B2 100 mg twice a day  Aleve as needed for moderate to severe headaches,  Possible bilateral carpal tunnel syndrome

## 2018-08-30 ENCOUNTER — Telehealth: Payer: Self-pay | Admitting: *Deleted

## 2018-08-30 LAB — VITAMIN B12: Vitamin B-12: 674 pg/mL (ref 232–1245)

## 2018-08-30 NOTE — Telephone Encounter (Signed)
-----   Message from Marcial Pacas, MD sent at 08/30/2018  8:39 AM EDT ----- Please call patient for normal laboratory result

## 2018-08-30 NOTE — Telephone Encounter (Signed)
Spoke to patient - she is aware of lab results. 

## 2018-09-02 DIAGNOSIS — Z6826 Body mass index (BMI) 26.0-26.9, adult: Secondary | ICD-10-CM | POA: Diagnosis not present

## 2018-09-02 DIAGNOSIS — N959 Unspecified menopausal and perimenopausal disorder: Secondary | ICD-10-CM | POA: Diagnosis not present

## 2018-09-02 DIAGNOSIS — R350 Frequency of micturition: Secondary | ICD-10-CM | POA: Diagnosis not present

## 2018-09-02 DIAGNOSIS — Z1231 Encounter for screening mammogram for malignant neoplasm of breast: Secondary | ICD-10-CM | POA: Diagnosis not present

## 2018-09-02 DIAGNOSIS — Z01411 Encounter for gynecological examination (general) (routine) with abnormal findings: Secondary | ICD-10-CM | POA: Diagnosis not present

## 2018-09-20 ENCOUNTER — Encounter (INDEPENDENT_AMBULATORY_CARE_PROVIDER_SITE_OTHER): Payer: 59 | Admitting: Neurology

## 2018-09-20 ENCOUNTER — Ambulatory Visit (INDEPENDENT_AMBULATORY_CARE_PROVIDER_SITE_OTHER): Payer: 59 | Admitting: Neurology

## 2018-09-20 DIAGNOSIS — Z0289 Encounter for other administrative examinations: Secondary | ICD-10-CM

## 2018-09-20 DIAGNOSIS — G5601 Carpal tunnel syndrome, right upper limb: Secondary | ICD-10-CM | POA: Diagnosis not present

## 2018-09-20 NOTE — Procedures (Signed)
Full Name: Lynn Ochoa Gender: Female MRN #: [id number] Date of Birth: [birth year]    Visit Date: 09/20/2018 08:21 Age: 52 Years 34 Months Old Examining Physician: Marcial Pacas, MD  Referring Physician: Krista Blue, MD History: 52 years old right-handed female, presented with few months history of right hand paresthesia  Summary of the tests: Nerve conduction study: Right median sensory responses showed mildly prolonged peak latency with mildly decreased to snap amplitude.  Right median motor responses was 1.2 ms prolonged in comparison to contralateral median motor response.  Bilateral ulnar sensory and motor responses were normal.  Electromyography: Selected needle examination of right upper extremity muscles were normal.  Conclusion:  This is a mild abnormal study.  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  There is no evidence of right cervical radiculopathy.   ------------------------------- Marcial Pacas, M.D.Ph.D.  [facility] [place], [place] [address] Tel: [phone] Fax: [phone]        [facility]    Nerve / Sites Muscle Latency Ref. Amplitude Ref. Rel Amp Segments Distance Velocity Ref. Area    ms ms mV mV %  cm m/s m/s mVms  L Median - APB     Wrist APB 3.0 ?4.4 8.6 ?4.0 100 Wrist - APB 7   26.4     Upper arm APB 6.4  8.6  100 Upper arm - Wrist 19 56 ?49 26.2  R Median - APB     Wrist APB 4.2 ?4.4 6.3 ?4.0 100 Wrist - APB 7   17.3     Upper arm APB 7.6  6.1  98.1 Upper arm - Wrist 19 56 ?49 16.7  L Ulnar - ADM     Wrist ADM 2.2 ?3.3 11.8 ?6.0 100 Wrist - ADM 7   28.8     B.Elbow ADM 5.1  11.5  97.2 B.Elbow - Wrist 17 59 ?49 28.6     A.Elbow ADM 6.8  10.7  93.5 A.Elbow - B.Elbow 10 60 ?49 27.5         A.Elbow - Wrist      L Peroneal - EDB     Ankle EDB 4.5 ?6.5 3.5 ?2.0 100 Ankle - EDB 9   11.3     Fib head EDB 9.9  3.5  101 Fib head - Ankle  27 50 ?44 11.3     Pop fossa EDB 11.7  3.6  104 Pop fossa - Fib head 10 56 ?44 11.8         Pop fossa - Ankle      L Tibial - AH     Ankle AH 3.4 ?5.8 13.9 ?4.0 100 Ankle - AH 9   24.1     Pop fossa AH 11.7  10.4  74.8 Pop fossa - Ankle 37 45 ?41 19.6               SNC    Nerve / Sites Rec. Site Peak Lat Ref.  Amp Ref. Segments Distance    ms ms V V  cm  L Sural - Ankle (Calf)     Calf Ankle 3.7 ?4.4 8 ?6 Calf - Ankle 14  L Superficial peroneal - Ankle     Lat leg Ankle 3.6 ?4.4 14 ?6 Lat leg - Ankle 14  R Superficial peroneal - Ankle     Lat leg Ankle 3.5 ?4.4 16 ?6 Lat leg -  Ankle 14  L Median - Orthodromic (Dig II, Mid palm)     Dig II Wrist 2.7 ?3.4 20 ?10 Dig II - Wrist 13  R Median - Orthodromic (Dig II, Mid palm)     Dig II Wrist 3.8 ?3.4 4 ?10 Dig II - Wrist 13  L Ulnar - Orthodromic, (Dig V, Mid palm)     Dig V Wrist 2.3 ?3.1 10 ?5 Dig V - Wrist 11  R Ulnar - Orthodromic, (Dig V, Mid palm)     Dig V Wrist 2.4 ?3.1 10 ?5 Dig V - Wrist 18                   F  Wave    Nerve F Lat Ref.   ms ms  L Tibial - AH 41.3 ?56.0  L Ulnar - ADM 23.6 ?32.0         EMG full       EMG Summary Table    Spontaneous MUAP Recruitment  Muscle IA Fib PSW Fasc Other Amp Dur. Poly Pattern  R. First dorsal interosseous Normal None None None _______ Normal Normal Normal Normal  R. Pronator teres Normal None None None _______ Normal Normal Normal Normal  R. Biceps brachii Normal None None None _______ Normal Normal Normal Normal  R. Deltoid Normal None None None _______ Normal Normal Normal Normal  R. Brachioradialis Normal None None None _______ Normal Normal Normal Normal

## 2018-10-01 ENCOUNTER — Ambulatory Visit: Payer: 59 | Admitting: Neurology

## 2018-10-26 DIAGNOSIS — Z23 Encounter for immunization: Secondary | ICD-10-CM | POA: Diagnosis not present

## 2018-10-29 ENCOUNTER — Ambulatory Visit: Payer: 59 | Admitting: Rheumatology

## 2018-10-30 DIAGNOSIS — Z23 Encounter for immunization: Secondary | ICD-10-CM | POA: Diagnosis not present

## 2018-10-30 DIAGNOSIS — Z0000 Encounter for general adult medical examination without abnormal findings: Secondary | ICD-10-CM | POA: Diagnosis not present

## 2018-11-04 DIAGNOSIS — Z13220 Encounter for screening for lipoid disorders: Secondary | ICD-10-CM | POA: Diagnosis not present

## 2019-02-21 ENCOUNTER — Other Ambulatory Visit: Payer: Self-pay | Admitting: Obstetrics and Gynecology

## 2019-02-25 DIAGNOSIS — Z6827 Body mass index (BMI) 27.0-27.9, adult: Secondary | ICD-10-CM | POA: Diagnosis not present

## 2019-02-25 DIAGNOSIS — Z01419 Encounter for gynecological examination (general) (routine) without abnormal findings: Secondary | ICD-10-CM | POA: Diagnosis not present

## 2019-03-26 DIAGNOSIS — Z1211 Encounter for screening for malignant neoplasm of colon: Secondary | ICD-10-CM | POA: Diagnosis not present

## 2019-03-26 DIAGNOSIS — Z1212 Encounter for screening for malignant neoplasm of rectum: Secondary | ICD-10-CM | POA: Diagnosis not present

## 2021-06-20 ENCOUNTER — Other Ambulatory Visit: Payer: Self-pay | Admitting: Family Medicine

## 2021-06-20 DIAGNOSIS — R221 Localized swelling, mass and lump, neck: Secondary | ICD-10-CM

## 2021-06-29 ENCOUNTER — Ambulatory Visit
Admission: RE | Admit: 2021-06-29 | Discharge: 2021-06-29 | Disposition: A | Discharge status: Home / Self Care | Payer: 59 | Source: Ambulatory Visit | Attending: Family Medicine | Admitting: Family Medicine

## 2021-06-29 DIAGNOSIS — R221 Localized swelling, mass and lump, neck: Secondary | ICD-10-CM

## 2021-07-29 ENCOUNTER — Other Ambulatory Visit: Payer: Self-pay

## 2021-07-29 ENCOUNTER — Ambulatory Visit
Admission: RE | Admit: 2021-07-29 | Discharge: 2021-07-29 | Disposition: A | Discharge status: Home / Self Care | Payer: 59 | Source: Ambulatory Visit | Attending: Home Modifications | Admitting: Home Modifications

## 2021-07-29 ENCOUNTER — Other Ambulatory Visit: Payer: Self-pay | Admitting: Home Modifications

## 2021-07-29 DIAGNOSIS — M25551 Pain in right hip: Secondary | ICD-10-CM

## 2023-02-26 ENCOUNTER — Other Ambulatory Visit: Payer: Self-pay | Admitting: Family Medicine

## 2023-02-26 DIAGNOSIS — R221 Localized swelling, mass and lump, neck: Secondary | ICD-10-CM

## 2023-03-15 ENCOUNTER — Ambulatory Visit
Admission: RE | Admit: 2023-03-15 | Discharge: 2023-03-15 | Disposition: A | Discharge status: Home / Self Care | Payer: 59 | Source: Ambulatory Visit | Attending: Family Medicine | Admitting: Family Medicine

## 2023-03-15 DIAGNOSIS — R221 Localized swelling, mass and lump, neck: Secondary | ICD-10-CM
